# Patient Record
Sex: Female | Born: 1980 | Race: Black or African American | Hispanic: No | Marital: Married | Smoking: Never smoker
Health system: Southern US, Community
[De-identification: ages and names within clinical notes are randomized; demographics above are authoritative.]

## PROBLEM LIST (undated history)

## (undated) DIAGNOSIS — R87810 Cervical high risk human papillomavirus (HPV) DNA test positive: Secondary | ICD-10-CM

## (undated) DIAGNOSIS — E669 Obesity, unspecified: Secondary | ICD-10-CM

## (undated) DIAGNOSIS — I10 Essential (primary) hypertension: Secondary | ICD-10-CM

## (undated) DIAGNOSIS — Z0000 Encounter for general adult medical examination without abnormal findings: Secondary | ICD-10-CM

## (undated) DIAGNOSIS — G4730 Sleep apnea, unspecified: Secondary | ICD-10-CM

## (undated) HISTORY — DX: Obesity, unspecified: E66.9

## (undated) HISTORY — PX: TUBAL LIGATION: SHX77

## (undated) HISTORY — DX: Sleep apnea, unspecified: G47.30

## (undated) HISTORY — DX: Encounter for general adult medical examination without abnormal findings: Z00.00

## (undated) HISTORY — DX: Essential (primary) hypertension: I10

## (undated) HISTORY — DX: Cervical high risk human papillomavirus (HPV) DNA test positive: R87.810

---

## 2001-05-27 ENCOUNTER — Other Ambulatory Visit: Admission: RE | Admit: 2001-05-27 | Discharge: 2001-05-27 | Payer: Self-pay | Admitting: Obstetrics and Gynecology

## 2001-06-06 ENCOUNTER — Emergency Department (HOSPITAL_COMMUNITY): Admission: EM | Admit: 2001-06-06 | Discharge: 2001-06-06 | Payer: Self-pay | Admitting: Emergency Medicine

## 2001-10-27 ENCOUNTER — Ambulatory Visit (HOSPITAL_COMMUNITY): Admission: RE | Admit: 2001-10-27 | Discharge: 2001-10-27 | Payer: Self-pay | Admitting: Obstetrics and Gynecology

## 2001-11-01 ENCOUNTER — Ambulatory Visit (HOSPITAL_COMMUNITY): Admission: RE | Admit: 2001-11-01 | Discharge: 2001-11-01 | Payer: Self-pay | Admitting: Internal Medicine

## 2001-11-04 ENCOUNTER — Ambulatory Visit (HOSPITAL_COMMUNITY): Admission: RE | Admit: 2001-11-04 | Discharge: 2001-11-04 | Payer: Self-pay | Admitting: Obstetrics and Gynecology

## 2001-11-07 ENCOUNTER — Ambulatory Visit (HOSPITAL_COMMUNITY): Admission: RE | Admit: 2001-11-07 | Discharge: 2001-11-07 | Payer: Self-pay | Admitting: Obstetrics and Gynecology

## 2001-11-09 ENCOUNTER — Inpatient Hospital Stay (HOSPITAL_COMMUNITY): Admission: RE | Admit: 2001-11-09 | Discharge: 2001-11-13 | Payer: Self-pay | Admitting: Obstetrics and Gynecology

## 2001-11-10 ENCOUNTER — Encounter: Payer: Self-pay | Admitting: Obstetrics and Gynecology

## 2002-06-18 ENCOUNTER — Emergency Department (HOSPITAL_COMMUNITY): Admission: EM | Admit: 2002-06-18 | Discharge: 2002-06-18 | Payer: Self-pay | Admitting: *Deleted

## 2003-01-21 ENCOUNTER — Emergency Department (HOSPITAL_COMMUNITY): Admission: EM | Admit: 2003-01-21 | Discharge: 2003-01-21 | Payer: Self-pay | Admitting: Internal Medicine

## 2003-11-04 ENCOUNTER — Emergency Department (HOSPITAL_COMMUNITY): Admission: EM | Admit: 2003-11-04 | Discharge: 2003-11-05 | Payer: Self-pay | Admitting: *Deleted

## 2004-09-16 ENCOUNTER — Emergency Department (HOSPITAL_COMMUNITY): Admission: EM | Admit: 2004-09-16 | Discharge: 2004-09-16 | Payer: Self-pay | Admitting: Emergency Medicine

## 2004-11-07 ENCOUNTER — Ambulatory Visit: Payer: Self-pay | Admitting: Family Medicine

## 2004-12-29 ENCOUNTER — Emergency Department (HOSPITAL_COMMUNITY): Admission: EM | Admit: 2004-12-29 | Discharge: 2004-12-29 | Payer: Self-pay | Admitting: Emergency Medicine

## 2005-05-05 ENCOUNTER — Emergency Department (HOSPITAL_COMMUNITY): Admission: EM | Admit: 2005-05-05 | Discharge: 2005-05-06 | Payer: Self-pay

## 2005-09-14 ENCOUNTER — Ambulatory Visit: Payer: Self-pay | Admitting: Family Medicine

## 2005-10-23 ENCOUNTER — Emergency Department (HOSPITAL_COMMUNITY): Admission: EM | Admit: 2005-10-23 | Discharge: 2005-10-23 | Payer: Self-pay | Admitting: Emergency Medicine

## 2005-11-03 ENCOUNTER — Inpatient Hospital Stay (HOSPITAL_COMMUNITY): Admission: AD | Admit: 2005-11-03 | Discharge: 2005-11-03 | Payer: Self-pay | Admitting: Obstetrics and Gynecology

## 2006-02-20 ENCOUNTER — Emergency Department (HOSPITAL_COMMUNITY): Admission: EM | Admit: 2006-02-20 | Discharge: 2006-02-20 | Payer: Self-pay | Admitting: Emergency Medicine

## 2006-04-29 ENCOUNTER — Observation Stay (HOSPITAL_COMMUNITY): Admission: RE | Admit: 2006-04-29 | Discharge: 2006-04-29 | Payer: Self-pay | Admitting: Obstetrics and Gynecology

## 2006-05-18 ENCOUNTER — Ambulatory Visit (HOSPITAL_COMMUNITY): Admission: AD | Admit: 2006-05-18 | Discharge: 2006-05-18 | Payer: Self-pay | Admitting: Obstetrics and Gynecology

## 2006-05-21 ENCOUNTER — Ambulatory Visit (HOSPITAL_COMMUNITY): Admission: AD | Admit: 2006-05-21 | Discharge: 2006-05-21 | Payer: Self-pay | Admitting: Obstetrics and Gynecology

## 2006-05-31 ENCOUNTER — Inpatient Hospital Stay (HOSPITAL_COMMUNITY): Admission: AD | Admit: 2006-05-31 | Discharge: 2006-06-03 | Payer: Self-pay | Admitting: Obstetrics and Gynecology

## 2006-05-31 ENCOUNTER — Encounter (INDEPENDENT_AMBULATORY_CARE_PROVIDER_SITE_OTHER): Payer: Self-pay | Admitting: *Deleted

## 2007-05-17 ENCOUNTER — Emergency Department (HOSPITAL_COMMUNITY): Admission: EM | Admit: 2007-05-17 | Discharge: 2007-05-17 | Payer: Self-pay | Admitting: Emergency Medicine

## 2007-08-03 ENCOUNTER — Ambulatory Visit: Payer: Self-pay | Admitting: Family Medicine

## 2007-08-03 LAB — CONVERTED CEMR LAB
BUN: 14 mg/dL (ref 6–23)
Basophils Absolute: 0.1 10*3/uL (ref 0.0–0.1)
Basophils Relative: 1 % (ref 0–1)
CO2: 24 meq/L (ref 19–32)
Calcium: 9.4 mg/dL (ref 8.4–10.5)
Chloride: 103 meq/L (ref 96–112)
Cholesterol: 184 mg/dL (ref 0–200)
Creatinine, Ser: 0.67 mg/dL (ref 0.40–1.20)
Eosinophils Absolute: 0.2 10*3/uL (ref 0.2–0.7)
Eosinophils Relative: 3 % (ref 0–5)
Glucose, Bld: 85 mg/dL (ref 70–99)
HCT: 38.1 % (ref 36.0–46.0)
HDL: 40 mg/dL (ref >39)
Hemoglobin: 12.1 g/dL (ref 12.0–15.0)
LDL Cholesterol: 121 mg/dL — ABNORMAL HIGH (ref 0–99)
Lymphocytes Relative: 42 % (ref 12–46)
Lymphs Abs: 2.5 10*3/uL (ref 0.7–4.0)
MCHC: 31.8 g/dL (ref 30.0–36.0)
MCV: 87.4 fL (ref 78.0–100.0)
Monocytes Absolute: 0.6 10*3/uL (ref 0.1–1.0)
Monocytes Relative: 9 % (ref 3–12)
Neutro Abs: 2.8 10*3/uL (ref 1.7–7.7)
Neutrophils Relative %: 45 % (ref 43–77)
Platelets: 503 10*3/uL — ABNORMAL HIGH (ref 150–400)
Potassium: 3.9 meq/L (ref 3.5–5.3)
RBC: 4.36 M/uL (ref 3.87–5.11)
RDW: 14.5 % (ref 11.5–15.5)
Sodium: 140 meq/L (ref 135–145)
TSH: 2.825 microintl units/mL (ref 0.350–5.50)
Total CHOL/HDL Ratio: 4.6
Triglycerides: 114 mg/dL (ref <150)
VLDL: 23 mg/dL (ref 0–40)
WBC: 6.1 10*3/uL (ref 4.0–10.5)

## 2007-08-18 ENCOUNTER — Encounter: Payer: Self-pay | Admitting: Family Medicine

## 2008-02-13 ENCOUNTER — Emergency Department (HOSPITAL_COMMUNITY): Admission: EM | Admit: 2008-02-13 | Discharge: 2008-02-13 | Payer: Self-pay | Admitting: Emergency Medicine

## 2008-05-08 ENCOUNTER — Telehealth: Payer: Self-pay | Admitting: Family Medicine

## 2008-12-15 ENCOUNTER — Emergency Department (HOSPITAL_COMMUNITY): Admission: EM | Admit: 2008-12-15 | Discharge: 2008-12-15 | Payer: Self-pay | Admitting: Emergency Medicine

## 2009-01-21 ENCOUNTER — Emergency Department (HOSPITAL_COMMUNITY): Admission: EM | Admit: 2009-01-21 | Discharge: 2009-01-21 | Payer: Self-pay | Admitting: Emergency Medicine

## 2009-08-19 ENCOUNTER — Emergency Department (HOSPITAL_COMMUNITY): Admission: EM | Admit: 2009-08-19 | Discharge: 2009-08-19 | Payer: Self-pay | Admitting: Emergency Medicine

## 2009-09-30 ENCOUNTER — Emergency Department (HOSPITAL_COMMUNITY): Admission: EM | Admit: 2009-09-30 | Discharge: 2009-09-30 | Payer: Self-pay | Admitting: Emergency Medicine

## 2009-11-24 ENCOUNTER — Emergency Department (HOSPITAL_COMMUNITY): Admission: EM | Admit: 2009-11-24 | Discharge: 2009-11-24 | Payer: Self-pay | Admitting: Emergency Medicine

## 2010-02-06 ENCOUNTER — Emergency Department (HOSPITAL_COMMUNITY): Admission: EM | Admit: 2010-02-06 | Discharge: 2010-02-06 | Payer: Self-pay | Admitting: Emergency Medicine

## 2010-09-16 NOTE — Progress Notes (Signed)
Summary: wants a phy. before ins. runs out  Phone Note Call from Patient   Summary of Call: wants to get a physical next week her insurance runs out the end of this month told her we had no openings but would ask call back at [phone] Initial call taken by: Lind Guest,  May 08, 2008 11:25 AM  Follow-up for Phone Call        pls sced before she loses her ins Follow-up by: Syliva Overman MD,  May 14, 2008 12:47 PM  Additional Follow-up for Phone Call Additional follow up Details #1::        COMING IN TOMORROW Additional Follow-up by: Lind Guest,  May 14, 2008 1:59 PM

## 2010-09-16 NOTE — Letter (Signed)
Summary: Historic Patient File  Historic Patient File   Imported By: Lind Guest 02/28/2010 13:30:53  _____________________________________________________________________  External Attachment:    Type:   Image     Comment:   External Document

## 2010-11-02 LAB — BASIC METABOLIC PANEL
BUN: 11 mg/dL (ref 6–23)
CO2: 23 mEq/L (ref 19–32)
Calcium: 8.8 mg/dL (ref 8.4–10.5)
Chloride: 106 mEq/L (ref 96–112)
Creatinine, Ser: 0.7 mg/dL (ref 0.4–1.2)
GFR calc Af Amer: >60 mL/min (ref >60)
GFR calc non Af Amer: >60 mL/min (ref >60)
Glucose, Bld: 120 mg/dL — ABNORMAL HIGH (ref 70–99)
Potassium: 3.2 mEq/L — ABNORMAL LOW (ref 3.5–5.1)
Sodium: 137 mEq/L (ref 135–145)

## 2010-11-02 LAB — URINALYSIS, ROUTINE W REFLEX MICROSCOPIC
Bilirubin Urine: NEGATIVE
Glucose, UA: NEGATIVE mg/dL
Ketones, ur: NEGATIVE mg/dL
Leukocytes, UA: NEGATIVE
Nitrite: NEGATIVE
Protein, ur: NEGATIVE mg/dL
Specific Gravity, Urine: >1.03 — ABNORMAL HIGH (ref 1.005–1.030)
Urobilinogen, UA: 0.2 mg/dL (ref 0.0–1.0)
pH: 5.5 (ref 5.0–8.0)

## 2010-11-02 LAB — DIFFERENTIAL
Basophils Absolute: 0.1 10*3/uL (ref 0.0–0.1)
Basophils Relative: 1 % (ref 0–1)
Eosinophils Absolute: 0.2 10*3/uL (ref 0.0–0.7)
Eosinophils Relative: 3 % (ref 0–5)
Lymphocytes Relative: 34 % (ref 12–46)
Lymphs Abs: 2.2 10*3/uL (ref 0.7–4.0)
Monocytes Absolute: 0.4 10*3/uL (ref 0.1–1.0)
Monocytes Relative: 6 % (ref 3–12)
Neutro Abs: 3.6 10*3/uL (ref 1.7–7.7)
Neutrophils Relative %: 56 % (ref 43–77)

## 2010-11-02 LAB — POCT CARDIAC MARKERS
CKMB, poc: <1 ng/mL — ABNORMAL LOW (ref 1.0–8.0)
Myoglobin, poc: 44.2 ng/mL (ref 12–200)
Troponin i, poc: <0.05 ng/mL (ref 0.00–0.09)

## 2010-11-02 LAB — CBC
HCT: 33.9 % — ABNORMAL LOW (ref 36.0–46.0)
Hemoglobin: 11.2 g/dL — ABNORMAL LOW (ref 12.0–15.0)
MCHC: 32.9 g/dL (ref 30.0–36.0)
MCV: 87.9 fL (ref 78.0–100.0)
Platelets: 392 10*3/uL (ref 150–400)
RBC: 3.86 MIL/uL — ABNORMAL LOW (ref 3.87–5.11)
RDW: 15.4 % (ref 11.5–15.5)
WBC: 6.4 10*3/uL (ref 4.0–10.5)

## 2010-11-02 LAB — URINE MICROSCOPIC-ADD ON

## 2010-11-02 LAB — POCT PREGNANCY, URINE: Preg Test, Ur: NEGATIVE

## 2010-11-05 LAB — DIFFERENTIAL
Basophils Absolute: 0 10*3/uL (ref 0.0–0.1)
Basophils Relative: 1 % (ref 0–1)
Eosinophils Absolute: 0.2 10*3/uL (ref 0.0–0.7)
Eosinophils Relative: 3 % (ref 0–5)
Lymphocytes Relative: 37 % (ref 12–46)
Lymphs Abs: 2.2 10*3/uL (ref 0.7–4.0)
Monocytes Absolute: 0.4 10*3/uL (ref 0.1–1.0)
Monocytes Relative: 7 % (ref 3–12)
Neutro Abs: 3 10*3/uL (ref 1.7–7.7)
Neutrophils Relative %: 52 % (ref 43–77)

## 2010-11-05 LAB — POCT CARDIAC MARKERS
CKMB, poc: <1 ng/mL — ABNORMAL LOW (ref 1.0–8.0)
Myoglobin, poc: 24.6 ng/mL (ref 12–200)
Troponin i, poc: <0.05 ng/mL (ref 0.00–0.09)

## 2010-11-05 LAB — BASIC METABOLIC PANEL
BUN: 12 mg/dL (ref 6–23)
CO2: 26 mEq/L (ref 19–32)
Calcium: 8.8 mg/dL (ref 8.4–10.5)
Chloride: 104 mEq/L (ref 96–112)
Creatinine, Ser: 0.94 mg/dL (ref 0.4–1.2)
GFR calc Af Amer: >60 mL/min (ref >60)
GFR calc non Af Amer: >60 mL/min (ref >60)
Glucose, Bld: 111 mg/dL — ABNORMAL HIGH (ref 70–99)
Potassium: 3.3 mEq/L — ABNORMAL LOW (ref 3.5–5.1)
Sodium: 137 mEq/L (ref 135–145)

## 2010-11-05 LAB — CBC
HCT: 33.8 % — ABNORMAL LOW (ref 36.0–46.0)
Hemoglobin: 11.4 g/dL — ABNORMAL LOW (ref 12.0–15.0)
MCHC: 33.6 g/dL (ref 30.0–36.0)
MCV: 87.3 fL (ref 78.0–100.0)
Platelets: 406 10*3/uL — ABNORMAL HIGH (ref 150–400)
RBC: 3.87 MIL/uL (ref 3.87–5.11)
RDW: 15.4 % (ref 11.5–15.5)
WBC: 5.8 10*3/uL (ref 4.0–10.5)

## 2010-11-05 LAB — D-DIMER, QUANTITATIVE: D-Dimer, Quant: 0.33 ug/mL-FEU (ref 0.00–0.48)

## 2010-11-24 LAB — CBC
HCT: 32.7 % — ABNORMAL LOW (ref 36.0–46.0)
Hemoglobin: 11.3 g/dL — ABNORMAL LOW (ref 12.0–15.0)
MCHC: 34.6 g/dL (ref 30.0–36.0)
MCV: 87.1 fL (ref 78.0–100.0)
Platelets: 366 10*3/uL (ref 150–400)
RBC: 3.75 MIL/uL — ABNORMAL LOW (ref 3.87–5.11)
RDW: 15.3 % (ref 11.5–15.5)
WBC: 6.6 10*3/uL (ref 4.0–10.5)

## 2010-11-24 LAB — URINE MICROSCOPIC-ADD ON

## 2010-11-24 LAB — URINALYSIS, ROUTINE W REFLEX MICROSCOPIC
Bilirubin Urine: NEGATIVE
Glucose, UA: NEGATIVE mg/dL
Ketones, ur: NEGATIVE mg/dL
Leukocytes, UA: NEGATIVE
Nitrite: NEGATIVE
Protein, ur: NEGATIVE mg/dL
Specific Gravity, Urine: 1.025 (ref 1.005–1.030)
Urobilinogen, UA: 0.2 mg/dL (ref 0.0–1.0)
pH: 5.5 (ref 5.0–8.0)

## 2010-11-24 LAB — BASIC METABOLIC PANEL
BUN: 17 mg/dL (ref 6–23)
CO2: 28 mEq/L (ref 19–32)
Calcium: 9.1 mg/dL (ref 8.4–10.5)
Chloride: 107 mEq/L (ref 96–112)
Creatinine, Ser: 0.67 mg/dL (ref 0.4–1.2)
GFR calc Af Amer: >60 mL/min (ref >60)
GFR calc non Af Amer: >60 mL/min (ref >60)
Glucose, Bld: 90 mg/dL (ref 70–99)
Potassium: 4 mEq/L (ref 3.5–5.1)
Sodium: 139 mEq/L (ref 135–145)

## 2010-11-24 LAB — DIFFERENTIAL
Basophils Absolute: 0.1 10*3/uL (ref 0.0–0.1)
Basophils Relative: 1 % (ref 0–1)
Eosinophils Absolute: 0.1 10*3/uL (ref 0.0–0.7)
Eosinophils Relative: 2 % (ref 0–5)
Lymphocytes Relative: 35 % (ref 12–46)
Lymphs Abs: 2.3 10*3/uL (ref 0.7–4.0)
Monocytes Absolute: 0.4 10*3/uL (ref 0.1–1.0)
Monocytes Relative: 7 % (ref 3–12)
Neutro Abs: 3.7 10*3/uL (ref 1.7–7.7)
Neutrophils Relative %: 56 % (ref 43–77)

## 2010-11-24 LAB — GLUCOSE, CAPILLARY: Glucose-Capillary: 72 mg/dL (ref 70–99)

## 2010-11-24 LAB — PREGNANCY, URINE: Preg Test, Ur: NEGATIVE

## 2010-11-25 LAB — URINALYSIS, ROUTINE W REFLEX MICROSCOPIC
Bilirubin Urine: NEGATIVE
Glucose, UA: NEGATIVE mg/dL
Leukocytes, UA: NEGATIVE
Nitrite: NEGATIVE
Protein, ur: NEGATIVE mg/dL
Specific Gravity, Urine: >1.03 — ABNORMAL HIGH (ref 1.005–1.030)
Urobilinogen, UA: 1 mg/dL (ref 0.0–1.0)
pH: 6.5 (ref 5.0–8.0)

## 2010-11-25 LAB — URINE CULTURE: Colony Count: 2000

## 2010-11-25 LAB — URINE MICROSCOPIC-ADD ON

## 2010-11-25 LAB — PREGNANCY, URINE: Preg Test, Ur: NEGATIVE

## 2010-12-02 ENCOUNTER — Emergency Department (HOSPITAL_COMMUNITY)
Admission: EM | Admit: 2010-12-02 | Discharge: 2010-12-02 | Disposition: A | Discharge status: Home / Self Care | Payer: Self-pay | Attending: Emergency Medicine | Admitting: Emergency Medicine

## 2010-12-02 DIAGNOSIS — H1030 Unspecified acute conjunctivitis, unspecified eye: Secondary | ICD-10-CM | POA: Insufficient documentation

## 2010-12-02 DIAGNOSIS — J301 Allergic rhinitis due to pollen: Secondary | ICD-10-CM | POA: Insufficient documentation

## 2011-01-02 NOTE — H&P (Signed)
Renee Bates, Renee Bates              ACCOUNT NO.:  [id number]   MEDICAL RECORD NO.:  [id number]          PATIENT TYPE:  INP   LOCATION:  A415                          FACILITY:  [facility]   PHYSICIAN:  Tilda Burrow, M.D. DATE OF BIRTH:  1981-04-14   DATE OF ADMISSION:  05/31/2006  DATE OF DISCHARGE:  LH                                HISTORY & PHYSICAL   REASON FOR ADMISSION:  1. Pregnancy at 37 weeks and 4 days.  2. Previous cesarean section with regular uterine contractions, bloody      show, and cervix 1 cm.   MEDICAL HISTORY:  Negative.   SURGICAL HISTORY:  Positive for previous cesarean section.   ALLERGIES:  She has no known allergies.   SOCIAL HISTORY:  She is single.   FAMILY HISTORY:  Positive for hypertension, diabetes.   PRENATAL COURSE:  Essentially uneventful.  Blood type is O-positive.  UDS  negative.  Rubella is immune.  Hepatitis B surface antigen negative.  HIV is  nonreactive.  HSV is negative.  Serology nonreactive.  GC and Chlamydia on  both cultures are negative. AFP she declined.  Twenty-eight hemoglobin 9.1,  28-week hematocrit 28.3.  One-hour glucose 106.   PHYSICAL EXAM:  Weight is 229, blood pressure 130/80.  There is 2+ blood in  her urine and 2+ leuks.  Fetal heart rate is 160, strong, and regular.  Cervix is a fingertip, feels somewhat thin, mid position, presenting part is  still fairly high in the pelvis.  There is bloody show noted on exam.   PLAN:  We are going to admit for Dr. Emelda Fear to assess and evaluate for  labor.      Zerita Boers, Lanier Clam      Tilda Burrow, M.D.  Electronically Signed    DL/MEDQ  D:  04/54/0981  T:  05/31/2006  Job:  [id number]   cc:   Dr. Renette Butters

## 2011-01-02 NOTE — H&P (Signed)
Renee Bates, Renee Bates              ACCOUNT NO.:  [id number]   MEDICAL RECORD NO.:  [id number]          PATIENT TYPE:  INP   LOCATION:  A415                          FACILITY:  [facility]   PHYSICIAN:  Tilda Burrow, M.D. DATE OF BIRTH:  [birth year]   DATE OF ADMISSION:  05/31/2006  DATE OF DISCHARGE:  LH                                HISTORY & PHYSICAL   ADMISSION DIAGNOSIS:  Pregnancy at 30-1/[redacted] weeks gestation.  Repeat cesarean  section, not for trial of labor, desire for permanent sterilization.   HISTORY OF PRESENT ILLNESS:  This 30 year old gravida 2, para 1, ab 0,  living 2, due to twin gestation of first pregnancy, is admitted at this time  after presenting with labor symptoms with contractions, sufficient bloody  show upon arrival at 11:30 a.m.  Prenatal course has been followed through  our office.  She originally considered VBAC but after deciding that this was  going to be her child, she decided appropriately to get repeat cesarean  section and tubal ligation.   PAST MEDICAL HISTORY:  Benign.   PAST SURGICAL HISTORY:  Cesarean section.   ALLERGIES:  None known.   Prenatal course notable for blood type O positive.  Antibody screen  negative.  Rubella immune.  Present hemoglobin 11, hematocrit 35. Hepatitis,  HIV, RPR, GC and Chlamydia negative.  Pap smear class I.  MSAFP declined.  Twenty-eight week hemoglobin indicating mild anemia with hemoglobin of 9.1  and hematocrit 28.  Glucose tolerance test 106 mg%.  Plans on taking the  baby to Dr. Geanie Cooley of [facility].   PHYSICAL EXAMINATION:  VITAL SIGNS:  Height 5 feet 2 inches, weight 237,  blood pressure 130/80, pulse 72.  Pupils equal, round and reactive  __________.  NECK:  Supple, trachea midline.  CHEST:  Clear to auscultation.  ABDOMEN:  Nontender.  External monitor shows contractions regular in nature  every seven minutes with light bloody show.  Cervix 1 cm by Lenard Simmer  office exam.   PLAN:  Admit begin fluid hydration, proceed to operating room for repeat  cesarean section today, midday.      Tilda Burrow, M.D.  Electronically Signed     JVF/MEDQ  D:  05/31/2006  T:  05/31/2006  Job:  [id number]   cc:   Corrie Mckusick, M.D.  Fax: [phone]   Francoise Schaumann. Raynelle Highland  Fax: [phone]   [facility]   Short Stay Center  [facility]

## 2011-01-02 NOTE — Discharge Summary (Signed)
[facility]  Patient:    Renee Bates, Renee Bates Visit Number: [id number] MRN: [id number]          Service Type: OBS Location: 4A A417 01 Attending Physician:  Tilda Burrow Dictated by:   Duane Lope, M.D. Admit Date:  11/09/2001 Discharge Date: 11/13/2001                             Discharge Summary  DISCHARGE DIAGNOSES: 1. Status post a low transverse cesarean section for twins. 2. Anemia. 3. Hypertension. 4. Mild postoperative pulmonary edema.  PROCEDURES:  A primary low transverse cesarean section for twins.  Please refer to the transcribed History and Physical and Operative Note for details of admission to hospital.  HOSPITAL COURSE:  The patient was admitted after she had her C-section for twins which went well. She was anemic postoperatively and was so postoperatively as well. Her lowest H&H was a 7.6 and 22.5, which is basically stable. She remained afebrile throughout the postoperative course, tolerated clear liquids and then a full diet, had flatus and bowel movement, and her incision was clean, dry, and intact. She did have on postoperative day #1 some low saturations and was found to have some mild pulmonary edema probably secondary her hypodynamic state with her anemia, the fact that she had a twin pregnancy, and that she had increased afterload with a blood pressure that was elevated. As a result, we treated her with Lasix and diminishing her afterload with Procardia XL which worked very well. She is doing great now with no complaints at all. She is ambulatory, voiding without complaints, and tolerating oral pain medicine with good relief. She will be discharged to home and followed up in the office in one week to check her CBC. She is discharged on Tylox, Motrin, Chromagen one a day, and Procardia XL 30 mg one a day. Dictated by:   Duane Lope, M.D. Attending Physician:  Tilda Burrow DD:  11/13/01 TD:  11/13/01 Job:  [id number]

## 2011-01-02 NOTE — Op Note (Signed)
[facility]  Patient:    Renee Bates, Renee Bates Visit Number: [id number] MRN: [id number]          Service Type: OBS Location: 4A A417 01 Attending Physician:  Tilda Burrow Dictated by:   Christin Bach, M.D. Admit Date:  11/09/2001   CC:         Lilyan Punt, M.D.   Operative Report  PREOPERATIVE DIAGNOSIS:  Pregnancy twins, 37 weeks, active labor, declining the effort of vaginal delivery.  POSTOPERATIVE DIAGNOSIS:  Pregnancy twins, 37 weeks, active labor, declining the effort of vaginal delivery.  OPERATION/PROCEDURE:  Primary low transverse cervical cesarean section.  SURGEON:  Christin Bach, M.D.  ASSISTANT:  Zerita Boers, N.M.  ANESTHESIA:  Spinal, Wahler, C.N.A.  COMPLICATIONS:  None.  ESTIMATED BLOOD LOSS:  400 cc.  FINDINGS:  Baby A female, vertex presentation well engaged in the pelvis, 6 pounds 9.3 ounces.  Baby B female, 5 pounds 1.5 ounces.  Apgars 8 and 9 each, cared for by Dr. Lilyan Punt, dictated elsewhere.  DETAILS OF PROCEDURE:  The patient was taken to the operating room and spinal anesthesia introduced.  The abdomen was prepped and draped and a Pfannenstiel incision performed in standard fashion with easy identification of bladder flap.  On the well-developed, lower uterine segment, a transverse uterine incision made with knife and extended using the index finger for traction. Fetal vertex was gently elevated out of the pelvis and rotated into the incision and the infant delivered by fundal pressure.  The infant was delivered easily; bulb suctioning of the clear amniotic fluid was performed; and then the cord clamped and then the infant passed to the waiting physician Dr. Lilyan Punt.  Apgars of 8 and 9 were assigned.  The second infant was found to be a vertex presentation.  Membranes ruptured, vertex guided through the incision, with easy delivery as well.  Cord was clamped and the infant passed to awaiting  pediatrician.  The baby was somewhat smaller but excellent respiratory effort on both infants noted.  Cord blood samples were obtained from each infant.  The placenta delivered intact.  Two separate placental beds noted, and the uterus irrigated with antibiotic solution and then a single layer running locking closure of the uterus performed.  The patient then had the bladder flap reapproximated with 2-0 chromic after a single additional interrupted suture of #0 chromic was necessary on the inferior aspects of the bladder flap.  The procedure then continued with 2-0 chromic closure of the bladder flap, irrigation of the abdomen, closure of the anterior peritoneum using 2-0 chromic, closure of the fascia with #0 Vicryl, and closure of the subcu fatty tissues with 2-0 plain and staple closure of the skin.  The patient tolerated the procedure well and went to recovery room in good condition. Dictated by:   Christin Bach, M.D. Attending Physician:  Tilda Burrow DD:  11/09/01 TD:  11/10/01 Job: [id number]

## 2011-01-02 NOTE — H&P (Signed)
[facility]  Patient:    Renee Bates, Renee Bates Visit Number: [id number] MRN: [id number]          Service Type: OBS Location: 4A A414 01 Attending Physician:  Tilda Burrow Dictated by:   Christin Bach, M.D. Admit Date:  11/07/2001 Discharge Date: 11/07/2001                           History and Physical  SUBJECTIVE:  Gravida 1, para 0 LMP July 25 placing [facility] April 30 with ultrasound assigned [facility] of April 15.  She is 37 weeks with best criteria.  She has been in labor since last night and presents to our office for evaluation of abdominal discomfort and is seen at 11:50 a.m. and she is progressing nicely.  Cervix is 5 cm dilated, 100% effaced, -1/-2 station and I have explained to the patient the excellent progress that she has made so far.  We have encouraged her to consider vaginal birth.  Patient declines option of same.  Baby B is also in a vertex presentation.  PAST MEDICAL HISTORY:  Benign.  PAST SURGICAL HISTORY:  Negative.  ALLERGIES:  No known drug allergies.  PHYSICAL EXAMINATION  VITAL SIGNS:  Height 5 feet 3 inches, weight 207 pounds which is a 36 pound weight gain, blood pressure 160/80.  ABDOMEN:  Fundal height 49 cm.  Estimated fetal weight 5 pounds on each baby.  LABORATORIES:  Urinalysis shows no blood, leukocytes.  PLAN:  Transfer to labor and delivery.  We have encouraged patient to consider vaginal birth but she, is at this time, not inclined to consider it.  Will send to labor and delivery and prepare for cesarean delivery.  ADDENDUM:  Blood type O+.  Urine drug screen negative.  Rubella immunity. Present hemoglobin 11, hematocrit 34.  Hepatitis, HIV, GC, chlamydia, RPR are all negative. Dictated by:   Christin Bach, M.D. Attending Physician:  Tilda Burrow DD:  11/09/01 TD:  11/09/01 Job: [id number]

## 2011-01-02 NOTE — H&P (Signed)
Renee Bates, Renee Bates              ACCOUNT NO.:  [id number]   MEDICAL RECORD NO.:  [id number]          PATIENT TYPE:  OIB   LOCATION:  A415                          FACILITY:  [facility]   PHYSICIAN:  Tilda Burrow, M.D. DATE OF BIRTH:  07-31-1981   DATE OF ADMISSION:  04/29/2006  DATE OF DISCHARGE:  LH                                HISTORY & PHYSICAL   ADMISSION DIAGNOSES:  1. Pregnancy at approximately 31 weeks.  2. Slipped on a puddle of water in the floor and fell on her bottom.  3. Lower abdominal pain.   PAST MEDICAL HISTORY:  Negative.   PAST SURGICAL HISTORY:  Positive for cesarean section.   ALLERGIES:  No known drug allergies.   FAMILY HISTORY:  Positive for hypertension and diabetes.   PRENATAL COURSE:  Essentially uneventful.  Blood type O positive, UDS  negative, rubella immune, hepatitis B surface antigen negative, HIV  nonreactive, serology nonreactive, GC and Chlamydia are negative.   PHYSICAL EXAMINATION:  VITAL SIGNS:  Fetal heart rate pattern is reactive  and has been reactive throughout the night.  The patient has required some  Tylox for pain in her vaginal area which responded well to Tylox.  PELVIC:  There is no suprapubic tenderness, no abdominal tenderness or  rigidity.   PLAN:  We are going to discharge her home to follow up Friday in the office.  Lortab one p.o. q.4 hours p.r.n. pain.  She should notify us of any  decreased fetal movement or problems.      Renee Bates, Renee Bates      Tilda Burrow, M.D.  Electronically Signed   DL/MEDQ  D:  16/05/9603  T:  04/29/2006  Job:  [id number]   cc:   [facility]

## 2011-01-02 NOTE — Op Note (Signed)
NAME:  Renee Bates, Renee Bates              ACCOUNT NO.:  [id number]   MEDICAL RECORD NO.:  [id number]          PATIENT TYPE:  INP   LOCATION:  A402                          FACILITY:  [facility]   PHYSICIAN:  Tilda Burrow, M.D. DATE OF BIRTH:  [birth year]   DATE OF PROCEDURE:  DATE OF DISCHARGE:                                 OPERATIVE REPORT   PREOPERATIVE DIAGNOSES:  1. Pregnancy 37 plus weeks.  2. Repeat cesarean section, not for trial of labor.  3. Prodromal labor symptoms.  4. Elective permanent sterilization.   POSTOPERATIVE DIAGNOSES:  1. Pregnancy 37 plus weeks.  2. Repeat cesarean section, not for trial of labor.  3. Prodromal labor symptoms.  4. Elective permanent sterilization.   PROCEDURE:  Repeat low transverse cervical cesarean section, bilateral  partial salpingectomy.   SURGEON:  Tilda Burrow, M.D.   ASSISTANT:  None.   ANESTHESIA:  Spinal.   COMPLICATIONS:  None.   FINDINGS:  A healthy female infant, Apgar's 9 and 9, weight __________.   INDICATIONS FOR PROCEDURE:  A 30 year old female with prior cesarean for  twins who presents with prodromal symptoms, contractions, moderately  uncomfortable with light bloody show on the morning of May 31, 2006.   DESCRIPTION OF PROCEDURE:  The patient was taken to the operating room,  prepped and draped for lower abdominal surgery with spinal anesthesia  introduced. The old cicatrix was widely excised removing approximately a 2  inch wide strip of skin including the old cicatrix and then the fascia was  opened transversely with ease. The peritoneal cavity was opened in the  midline, some omental adhesions to the anterior abdominal wall removed,  bladder flap developed on the lower uterine segment and a transverse uterine  incision made using the knife followed by lateral traction and guidance of  the vertex through the incision. Amniotic fluid was clear without malodor.  Bulb suctioning was performed prior to delivering  the fetal body, the right  arm was delivered first. The baby cried vigorously with a strong loud voice.  The amniotic fluid was without malodor.   The cord was clamped, the baby cared for by Dr. Milford Cage followed by  excretion  of the placenta, membranes intact, irrigation of the uterus with antibiotic  solution and a single layer of running locking closure of the uterine  incision. The bladder flap was closed with 2-0 chromic and the abdomen  irrigated and confirmed as hemostatic.   Tubal ligation was then performed by identifying each fallopian tube,  elevating the tube, doubly ligating around the mid segment knuckle of tube  and excising the incarcerated knuckle of tube for histologic confirmation of  sterilization. This was performed without difficulty bilaterally.   The anterior peritoneum was irrigated, closed with 2-0 chromic, the fascia  trimmed of some rough edges on the inferior aspect and then closed with  running #0 Vicryl and then subcutaneous tissues closed with 5 interrupted  sutures of 2-0 plain with good skin edge reapproximation. Sponge and needle  counts correct.      Tilda Burrow, M.D.  Electronically Signed  JVF/MEDQ  D:  05/31/2006  T:  06/01/2006  Job:  [id number]   cc:   Francoise Schaumann. Milford Cage DO, FAAP  Fax: [phone]

## 2011-01-02 NOTE — Discharge Summary (Signed)
[name], Renee Bates              ACCOUNT NO.:  [id number]   MEDICAL RECORD NO.:  [id number]          PATIENT TYPE:  INP   LOCATION:  A412                          FACILITY:  [facility]   PHYSICIAN:  Tilda Burrow, M.D. DATE OF BIRTH:  [birth year]   DATE OF ADMISSION:  05/31/2006  DATE OF DISCHARGE:  10/18/2007LH                                 DISCHARGE SUMMARY   ADMISSION DIAGNOSES:  1. Pregnancy at 37 weeks for repeat cesarean section after trial of labor.  2. Prodromal labor.  3. Elective sterilization.   DISCHARGE DIAGNOSES:  1. Pregnancy at 37 weeks for repeat cesarean section after trial of labor,      delivered.  2. Prodromal labor.  3. Elective sterilization.   PROCEDURES:  Repeat low transverse cervical cesarean section and bilateral  partial salpingectomy, Tilda Burrow, M.D., performed on the admission  date which was May 31, 2006, discharged June 03, 2006.   DISCHARGE MEDICATIONS:  1. Vicodin 5/500, 30 tablets, 1-2 q. 4 h p.r.n. pain.  2. Prenatal vitamins daily x30 days.  3. Chromagen Forte twice daily x30 days.   FOLLOW UP:  Eight days staple removal.   HOSPITAL SUMMARY:  This is a 30 year old female who was admitted after  presenting with mild labor symptoms on May 31, 2006.   HOSPITAL COURSE:  The patient was admitted, had uncomplicated cesarean  section and tubal ligation with wide excision of cicatrix to try to leave  her with a smooth surgical scar.  The patient did well post surgically.  Stayed 72 yours postop. Was discharged home on October 18 with hemoglobin 9,  hematocrit 28.  Maternal blood type O positive.  The baby is not  circumcised. She will be bottle feeding.  Follow up in 8 days.      Tilda Burrow, M.D.  Electronically Signed     JVF/MEDQ  D:  06/03/2006  T:  06/04/2006  Job:  [id number]   cc:   [facility]   Francoise Schaumann. Milford Cage DO, FAAP  Fax: [phone]

## 2011-05-16 ENCOUNTER — Encounter: Payer: Self-pay | Admitting: *Deleted

## 2011-05-16 ENCOUNTER — Emergency Department (HOSPITAL_COMMUNITY)
Admission: EM | Admit: 2011-05-16 | Discharge: 2011-05-16 | Disposition: A | Discharge status: Home / Self Care | Payer: Self-pay | Attending: Emergency Medicine | Admitting: Emergency Medicine

## 2011-05-16 DIAGNOSIS — R252 Cramp and spasm: Secondary | ICD-10-CM | POA: Insufficient documentation

## 2011-05-16 DIAGNOSIS — M79609 Pain in unspecified limb: Secondary | ICD-10-CM | POA: Insufficient documentation

## 2011-05-16 LAB — BASIC METABOLIC PANEL
BUN: 14 mg/dL (ref 6–23)
CO2: 27 mEq/L (ref 19–32)
Calcium: 9.3 mg/dL (ref 8.4–10.5)
Chloride: 106 mEq/L (ref 96–112)
Creatinine, Ser: 0.54 mg/dL (ref 0.50–1.10)
GFR calc Af Amer: >60 mL/min (ref >60)
GFR calc non Af Amer: >60 mL/min (ref >60)
Glucose, Bld: 87 mg/dL (ref 70–99)
Potassium: 4 mEq/L (ref 3.5–5.1)
Sodium: 139 mEq/L (ref 135–145)

## 2011-05-16 LAB — D-DIMER, QUANTITATIVE: D-Dimer, Quant: 0.22 ug/mL-FEU (ref 0.00–0.48)

## 2011-05-16 MED ORDER — OXYCODONE-ACETAMINOPHEN 5-325 MG PO TABS: 1.0000 | ORAL_TABLET | ORAL | Status: AC | PRN

## 2011-05-16 MED ORDER — IBUPROFEN 600 MG PO TABS: 600.0000 mg | ORAL_TABLET | Freq: Four times a day (QID) | ORAL | Status: AC | PRN

## 2011-05-16 MED ORDER — OXYCODONE-ACETAMINOPHEN 5-325 MG PO TABS
2.0000 | ORAL_TABLET | Freq: Once | ORAL | Status: AC
  Administered 2011-05-16: 2 via ORAL
  Filled 2011-05-16: qty 2

## 2011-05-16 NOTE — ED Provider Notes (Signed)
History  Scribed for Dr.Treina Arscott, the patient was seen in room APA12. The chart was scribed by Gilman Schmidt. The patients care was started at 0922.  CSN: [id number] Arrival date & time: 05/16/2011  9:12 AM  Chief Complaint  Patient presents with  . Leg Pain   HPI Renee Bates is a 30 y.o. female who presents to the Emergency Department complaining of sharp right leg calf pain. Pt reports waking up with pain and not being able to walk to the bathroom. States that she went to bed feeling fine. Pt denies any recent injury or recent activity. Symptoms are exacerbated by stretching out leg and by walking. Denies any weakness in foot, CP, or SOB. Denies being on birth control. Denies any recent travel. There are no other associated symptoms and no other alleviating or aggravating factors.   HPI ELEMENTS:  Location: right leg calf Onset: this morning Duration: persistent since onset  Timing: constant  Quality: sharp  Modifying factors: exacerbated by stretching out leg and walking Context:  as above  Associated symptoms: denies any weakness in foot, CP, or SOB   PAST MEDICAL HISTORY:  History reviewed. No pertinent past medical history.   PAST SURGICAL HISTORY:  Past Surgical History  Procedure Date  . Cesarean section   . Tubal ligation      MEDICATIONS:  Previous Medications   No medications on file   Not currently on birth control.   ALLERGIES:  Allergies as of 05/16/2011  . (No Known Allergies)     FAMILY HISTORY:  History reviewed. No pertinent family history.   SOCIAL HISTORY: History  Substance Use Topics  . Smoking status: Never Smoker   . Smokeless tobacco: Not on file  . Alcohol Use: Yes     occasionally   No recent travel.   Review of Systems  Respiratory: Negative for shortness of breath.   Cardiovascular: Negative for chest pain and leg swelling.  Musculoskeletal:       Leg Pain  Neurological: Negative for weakness and numbness.  All other systems  reviewed and are negative.    Allergies  Review of patient's allergies indicates no known allergies.  Home Medications  No current outpatient prescriptions on file.  BP 154/104  Pulse 75  Temp(Src) 98.5 F (36.9 C) (Oral)  Resp 17  Ht 5\' 2"  (1.575 m)  Wt 185 lb (83.915 kg)  BMI 33.84 kg/m2  SpO2 99%  LMP 05/07/2011  Physical Exam  CONSTITUTIONAL: Well developed/well nourished HEAD AND FACE: Normocephalic/atraumatic EYES: EOMI/PERRL ENMT: Mucous membranes moist NECK: supple no meningeal signs CV: S1/S2 noted, no murmurs/rubs/gallops noted LUNGS: Lungs are clear to auscultation bilaterally, no apparent distress ABDOMEN: soft, nontender, no rebound or guarding NEURO: Pt is awake/alert, moves all extremitiesx4, distal n/v intact on right LE EXTREMITIES: pulses normal, point tenderness in calf of right leg, no redness, no cord noted, no edema  Right Achilles intact SKIN: warm, color normal PSYCH: no abnormalities of mood noted  ED Course  Procedures  OTHER DATA REVIEWED: Nursing notes, vital signs, and past medical records reviewed. All labs/vitals reviewed and considered  DIAGNOSTIC STUDIES: Oxygen Saturation is 99% on room air, normal by my interpretation.    LABS: Results for orders placed during the hospital encounter of 05/16/11  D-DIMER, QUANTITATIVE      Component Value Range   D-Dimer, Quant 0.22  0.00 - 0.48 (ug/mL-FEU)  BASIC METABOLIC PANEL      Component Value Range   Sodium 139  135 -  145 (mEq/L)   Potassium 4.0  3.5 - 5.1 (mEq/L)   Chloride 106  96 - 112 (mEq/L)   CO2 27  19 - 32 (mEq/L)   Glucose, Bld 87  70 - 99 (mg/dL)   BUN 14  6 - 23 (mg/dL)   Creatinine, Ser 1.61  0.50 - 1.10 (mg/dL)   Calcium 9.3  8.4 - 09.6 (mg/dL)   GFR calc non Af Amer >60  >60 (mL/min)   GFR calc Af Amer >60  >60 (mL/min)    ED COURSE / COORDINATION OF CARE: 0922:  - Patient evaluated by ED physician, D-dimer, BMP ordered 11:03 AM Labs reassuring No evidence of  bony injury, no evidence of cellulitis No neurovascular deficits Patient is able to walk in room Advised to have blood pressure rechecked within one month    IMPRESSION: Diagnoses that have been ruled out:  Diagnoses that are still under consideration:  Final diagnoses:    PLAN:  Home Narcotic pain medication/NSAIDS The patient is to return the emergency department if there is any worsening of symptoms.   CONDITION ON DISCHARGE: Good   SCRIBE ATTESTATION: I personally performed the services described in this documentation, which was scribed in my presence. The recorded information has been reviewed and considered. Joya Gaskins, MD             Joya Gaskins, MD 05/16/11 [phone]

## 2011-05-16 NOTE — ED Notes (Signed)
Pt a/ox4. Resp even and unlabored. NAD at this time. D/C instructions and Rx x 2 reviewed with pt. Pt verbalized understanding. Pt ambulated with steady gate to nurses station and transported to lobby via w/c.

## 2011-05-16 NOTE — ED Notes (Signed)
Pt alert and oriented x 3. Skin warm and dry. Color pink. Breath sounds clear and equal bilaterally. No redness or edema noted in right calf. States that the pains are shooting.

## 2011-05-16 NOTE — ED Notes (Signed)
Pt c/o sharp pains shooting up her right calf since awakening this am. Pt denies injury. Strong pedal pulses palpated. No edema or redness noted to calf.

## 2013-08-24 ENCOUNTER — Ambulatory Visit (INDEPENDENT_AMBULATORY_CARE_PROVIDER_SITE_OTHER): Payer: BC Managed Care – PPO | Admitting: Obstetrics & Gynecology

## 2013-08-24 ENCOUNTER — Encounter: Payer: Self-pay | Admitting: Obstetrics & Gynecology

## 2013-08-24 VITALS — BP 140/90 | Ht 62.0 in | Wt 231.0 lb

## 2013-08-24 DIAGNOSIS — N920 Excessive and frequent menstruation with regular cycle: Secondary | ICD-10-CM

## 2013-08-24 MED ORDER — MEGESTROL ACETATE 40 MG PO TABS: 40.0000 mg | ORAL_TABLET | Freq: Every day | ORAL | Status: DC

## 2013-08-24 NOTE — Progress Notes (Signed)
Patient ID: Renee Bates, female   DOB: 08/18/1980, 33 y.o.   MRN: [id number] Periods have gotten worse over the past year or so Has 8 days of bleeding then off 2 weeks then back off Heavy Clots Heavy cramping, takes tylenol not helpful Had tubal  Not good on ocp, has tried before  Will suppress with megace  Follow up sonogram 1 month and consider ablation  History reviewed. No pertinent past medical history.  Past Surgical History  Procedure Laterality Date  . Cesarean section    . Tubal ligation      OB History   Grav Para Term Preterm Abortions TAB SAB Ect Mult Living                  No Known Allergies  History   Social History  . Marital Status: Single    Spouse Name: N/A    Number of Children: N/A  . Years of Education: N/A   Social History Main Topics  . Smoking status: Never Smoker   . Smokeless tobacco: None  . Alcohol Use: Yes     Comment: occasionally  . Drug Use: No  . Sexual Activity: No   Other Topics Concern  . None   Social History Narrative  . None    History reviewed. No pertinent family history.

## 2013-09-08 ENCOUNTER — Other Ambulatory Visit (HOSPITAL_COMMUNITY): Payer: Self-pay | Admitting: *Deleted

## 2013-09-08 DIAGNOSIS — N920 Excessive and frequent menstruation with regular cycle: Secondary | ICD-10-CM

## 2013-09-14 ENCOUNTER — Ambulatory Visit (HOSPITAL_COMMUNITY): Admission: RE | Admit: 2013-09-14 | Payer: BC Managed Care – PPO | Source: Ambulatory Visit

## 2013-09-14 ENCOUNTER — Ambulatory Visit (HOSPITAL_COMMUNITY): Payer: BC Managed Care – PPO | Attending: *Deleted

## 2013-09-21 ENCOUNTER — Ambulatory Visit (INDEPENDENT_AMBULATORY_CARE_PROVIDER_SITE_OTHER): Payer: BC Managed Care – PPO | Admitting: Surgery

## 2013-09-21 ENCOUNTER — Encounter (INDEPENDENT_AMBULATORY_CARE_PROVIDER_SITE_OTHER): Payer: Self-pay | Admitting: Surgery

## 2013-09-21 ENCOUNTER — Ambulatory Visit: Payer: BC Managed Care – PPO | Admitting: Obstetrics & Gynecology

## 2013-09-21 ENCOUNTER — Other Ambulatory Visit: Payer: BC Managed Care – PPO

## 2013-09-21 VITALS — BP 118/82 | HR 72 | Resp 14 | Ht 62.0 in | Wt 232.4 lb

## 2013-09-21 DIAGNOSIS — Z6841 Body Mass Index (BMI) 40.0 and over, adult: Secondary | ICD-10-CM

## 2013-09-21 DIAGNOSIS — I10 Essential (primary) hypertension: Secondary | ICD-10-CM

## 2013-09-21 LAB — CBC WITH DIFFERENTIAL/PLATELET
Basophils Absolute: 0.1 10*3/uL (ref 0.0–0.1)
Basophils Relative: 1 % (ref 0–1)
Eosinophils Absolute: 0.1 10*3/uL (ref 0.0–0.7)
Eosinophils Relative: 1 % (ref 0–5)
HCT: 34.1 % — ABNORMAL LOW (ref 36.0–46.0)
Hemoglobin: 11.6 g/dL — ABNORMAL LOW (ref 12.0–15.0)
Lymphocytes Relative: 43 % (ref 12–46)
Lymphs Abs: 2.9 10*3/uL (ref 0.7–4.0)
MCH: 28.6 pg (ref 26.0–34.0)
MCHC: 34 g/dL (ref 30.0–36.0)
MCV: 84 fL (ref 78.0–100.0)
Monocytes Absolute: 0.5 10*3/uL (ref 0.1–1.0)
Monocytes Relative: 7 % (ref 3–12)
Neutro Abs: 3.3 10*3/uL (ref 1.7–7.7)
Neutrophils Relative %: 48 % (ref 43–77)
Platelets: 497 10*3/uL — ABNORMAL HIGH (ref 150–400)
RBC: 4.06 MIL/uL (ref 3.87–5.11)
RDW: 14.2 % (ref 11.5–15.5)
WBC: 6.8 10*3/uL (ref 4.0–10.5)

## 2013-09-21 LAB — COMPREHENSIVE METABOLIC PANEL
ALT: 12 U/L (ref 0–35)
AST: 10 U/L (ref 0–37)
Albumin: 4 g/dL (ref 3.5–5.2)
Alkaline Phosphatase: 60 U/L (ref 39–117)
BUN: 17 mg/dL (ref 6–23)
CO2: 25 mEq/L (ref 19–32)
Calcium: 9.3 mg/dL (ref 8.4–10.5)
Chloride: 105 mEq/L (ref 96–112)
Creat: 0.66 mg/dL (ref 0.50–1.10)
Glucose, Bld: 109 mg/dL — ABNORMAL HIGH (ref 70–99)
Potassium: 3.9 mEq/L (ref 3.5–5.3)
Sodium: 139 mEq/L (ref 135–145)
Total Bilirubin: 0.4 mg/dL (ref 0.2–1.2)
Total Protein: 7.2 g/dL (ref 6.0–8.3)

## 2013-09-21 LAB — HEMOGLOBIN A1C
Hgb A1c MFr Bld: 5.8 % — ABNORMAL HIGH (ref <5.7)
Mean Plasma Glucose: 120 mg/dL — ABNORMAL HIGH (ref <117)

## 2013-09-21 LAB — T4: T4, Total: 8.7 ug/dL (ref 5.0–12.5)

## 2013-09-21 LAB — TSH: TSH: 1.847 u[IU]/mL (ref 0.350–4.500)

## 2013-09-21 NOTE — Progress Notes (Signed)
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 She currently has a BMI of 42. He is a nonsmoker, non-aspirin taking young woman with hypertension and he does not have diabetes. She has watched a video send a lot of work and study and has decided she would like to have a gastric bypass. I discussed that with her in some detail and she is aware of the procedure and is eager to get started on a bariatric journey.  She denies any history of DVT problems in her legs. She has had 2 prior C-sections.  Past Medical History  Diagnosis Date  . Hypertension     Past Surgical History  Procedure Laterality Date  . Cesarean section    . Tubal ligation      Current Outpatient Prescriptions  Medication Sig Dispense Refill  . hydrochlorothiazide (HYDRODIURIL) 25 MG tablet Take 25 mg by mouth daily.      . megestrol (MEGACE) 40 MG tablet Take 1 tablet (40 mg total) by mouth daily.  30 tablet  3  . acetaminophen (TYLENOL) 500 MG tablet Take 1,000 mg by mouth every 6 (six) hours as needed. For pain        No current facility-administered medications for this visit.   Review of patient's allergies indicates no known allergies. No family history on file. Social History:   reports that she has never smoked. She does not have any smokeless tobacco history on file. She reports that she drinks alcohol. She reports that she does not use illicit drugs.   REVIEW OF SYSTEMS - PERTINENT POSITIVES ONLY: Negative except for issues referable to her hypertension and obesity.  Physical Exam:   Blood pressure 118/82, pulse 72, resp. rate 14, height 5\' 2"  (1.575 m), weight 232 lb 6.4 oz (105.416 kg), last menstrual period 08/18/2013. Body mass index is 42.5 kg/(m^2).  Gen:  WDWN African American female NAD  Neurological: Alert and oriented to person,  place, and time. Motor and sensory function is grossly intact  Head: Normocephalic and atraumatic.  Eyes: Conjunctivae are normal. Pupils are equal, round, and reactive to light. No scleral icterus.  Neck: Normal range of motion. Neck supple. No tracheal deviation or thyromegaly present.  Cardiovascular:  SR without murmurs or gallops.  No carotid bruits Respiratory: Effort normal.  No respiratory distress. No chest wall tenderness. Breath sounds normal.  No wheezes, rales or rhonchi.  Abdomen:  Obese and nontender GU: Musculoskeletal: Normal range of motion. Extremities are nontender. No cyanosis, edema or clubbing noted Lymphadenopathy: No cervical, preauricular, postauricular or axillary adenopathy is present Skin: Skin is warm and dry. No rash noted. No diaphoresis. No erythema. No pallor. Pscyh: Normal mood and affect. Behavior is normal. Judgment and thought content normal.   LABORATORY RESULTS: No results found for this or any previous visit (from the past 48 hour(s)).  RADIOLOGY RESULTS: No results found.  Problem List: Patient Active Problem List   Diagnosis Date Noted  . Hypertension 09/21/2013  . Morbid obesity with body mass index of 40.0-44.9 in adult 09/21/2013    Assessment & Plan: Morbid obesity BMI 42 for consideration for laparoscopic Roux-en-Y gastric bypass.    Matt B. Daphine DeutscherMartin, MD, [facility]  [facility] [phone] [phone]  09/21/2013 11:52 AM

## 2013-09-21 NOTE — Addendum Note (Signed)
Addended by: Brennan BaileyBROOKS, Jannely Henthorn on: 09/21/2013 12:06 PM   Modules accepted: Orders

## 2013-09-21 NOTE — Patient Instructions (Signed)
Gastric Bypass Surgery Care After Refer to this sheet in the next few weeks. These discharge instructions provide you with general information on caring for yourself after you leave the hospital. Your caregiver may also give you specific instructions. Your treatment has been planned according to the most current medical practices available, but unavoidable complications sometimes occur. If you have any problems or questions after discharge, call your caregiver. HOME CARE INSTRUCTIONS  Activity  Take frequent walks throughout the day. This will help to prevent blood clots. Do not sit for longer than 45 minutes to 1 hour while awake for 4 to 6 weeks after surgery.  Continue to do coughing and deep breathing exercises once you get home. This will help to prevent pneumonia.  Do not do strenuous activities, such as heavy lifting, pushing, or pulling, until after your follow-up visit with your caregiver. Do not lift anything heavier than 10 lb (4.5 kg).  Talk with your caregiver about when you may return to work and your exercise routine.  Do not drive while taking prescription pain medicine. Nutrition  It is very important that you drink at least 80 oz (2,400 mL) of fluid a day.  You should stay on a liquid diet until your follow-up visit with your caregiver. Keep sugar-free, liquid items on hand, including:  Tea: hot or cold. Drink only decaffeinated for the first month.  Broths: beef, chicken, vegetable.  Others: water, sugar-free frozen ice pops, flavored water, gelatin (after 1 week).  Do not consume caffeine for 1 month. Large amounts of caffeine can cause dehydration.  A dietician may also give you specific instructions.  Follow your caregiver's recommendations about vitamins and protein requirements after surgery. Hygiene  You may shower and wash your hair 2 days after surgery. Pat incisions dry. Do not rub incisions with a washcloth or towel.  Follow your caregiver's  recommendations about baths and pools following surgery. Pain control  If a prescription medicine was given, follow your caregiver's directions.  You may feel some gas pain caused by the carbon dioxide used to inflate your abdomen during surgery. This pain can be felt in your chest, shoulder, back, or abdominal area. Moving around often is advised. Incision care  You may have 4 or more small incisions. They are closed with skin adhesive strips. Skin adhesive strips can get wet and will fall off on their own. Check your incisions and surrounding area daily for any redness, swelling, discoloration, fluid (drainage), or bleeding. Dark red, dried blood may appear under these coverings. This is normal.  If you have a drain, it will be removed at your follow-up visit or before you leave the hospital.  If your drain is left in, follow your caregiver's instructions on drain care.  If your drain is taken out, keep a clean, dry bandage over the drain site. SEEK MEDICAL CARE IF:   You develop persistent nausea and vomiting.  You have pain and discomfort with swallowing.  You have pain, swelling, or warmth in the lower extremities.  You have an oral temperature above 102 F (38.9 C).  You develop chills.  Your incision sites look red, swollen, or have drainage.  Your stool is black, tarry, or maroon in color.  You are lightheaded when standing.  You notice a bruise getting larger.  You have any questions or concerns. SEEK IMMEDIATE MEDICAL CARE IF:   You have chest pain.  You have severe calf pain or pain not relieved by medicine.  You develop shortness of   breath or difficulty breathing.  There is bright red blood coming from the drain.  You feel confused.  You have slurred speech.  You suddenly feel weak. MAKE SURE YOU:   Understand these instructions.  Will watch your condition.  Will get help right away if you are not doing well or get worse. Document Released:  03/17/2004 Document Revised: 11/28/2012 Document Reviewed: 12/24/2009 ExitCare Patient Information 2014 [employer].  

## 2013-09-22 ENCOUNTER — Other Ambulatory Visit: Payer: BC Managed Care – PPO

## 2013-09-22 ENCOUNTER — Ambulatory Visit: Payer: BC Managed Care – PPO | Admitting: Obstetrics & Gynecology

## 2013-09-22 LAB — PREGNANCY, URINE: Preg Test, Ur: NEGATIVE

## 2013-09-28 ENCOUNTER — Ambulatory Visit (HOSPITAL_COMMUNITY)
Admission: RE | Admit: 2013-09-28 | Discharge: 2013-09-28 | Disposition: A | Discharge status: Home / Self Care | Payer: BC Managed Care – PPO | Source: Ambulatory Visit | Attending: Surgery | Admitting: Surgery

## 2013-09-28 ENCOUNTER — Other Ambulatory Visit: Payer: Self-pay

## 2013-09-28 DIAGNOSIS — I10 Essential (primary) hypertension: Secondary | ICD-10-CM

## 2013-09-28 DIAGNOSIS — Z6841 Body Mass Index (BMI) 40.0 and over, adult: Secondary | ICD-10-CM | POA: Insufficient documentation

## 2013-09-28 DIAGNOSIS — R160 Hepatomegaly, not elsewhere classified: Secondary | ICD-10-CM | POA: Insufficient documentation

## 2013-10-13 ENCOUNTER — Ambulatory Visit (HOSPITAL_COMMUNITY)
Admission: RE | Admit: 2013-10-13 | Discharge: 2013-10-13 | Disposition: A | Discharge status: Home / Self Care | Payer: BC Managed Care – PPO | Source: Ambulatory Visit | Attending: Surgery | Admitting: Surgery

## 2013-10-13 ENCOUNTER — Encounter (HOSPITAL_COMMUNITY)
Admission: RE | Disposition: A | Discharge status: Home / Self Care | Payer: Self-pay | Source: Ambulatory Visit | Attending: Surgery

## 2013-10-13 HISTORY — PX: BREATH TEK H PYLORI: SHX5422

## 2013-10-13 SURGERY — BREATH TEST, FOR HELICOBACTER PYLORI

## 2013-10-14 ENCOUNTER — Encounter (HOSPITAL_COMMUNITY): Payer: Self-pay | Admitting: Surgery

## 2013-10-18 ENCOUNTER — Ambulatory Visit (HOSPITAL_BASED_OUTPATIENT_CLINIC_OR_DEPARTMENT_OTHER): Payer: BC Managed Care – PPO | Attending: Surgery

## 2013-10-18 VITALS — Ht 62.0 in | Wt 232.0 lb

## 2013-10-18 DIAGNOSIS — I10 Essential (primary) hypertension: Secondary | ICD-10-CM

## 2013-10-18 DIAGNOSIS — G4733 Obstructive sleep apnea (adult) (pediatric): Secondary | ICD-10-CM

## 2013-10-18 DIAGNOSIS — Z6841 Body Mass Index (BMI) 40.0 and over, adult: Secondary | ICD-10-CM

## 2013-10-21 DIAGNOSIS — I10 Essential (primary) hypertension: Secondary | ICD-10-CM

## 2013-10-21 DIAGNOSIS — G4733 Obstructive sleep apnea (adult) (pediatric): Secondary | ICD-10-CM

## 2013-10-21 NOTE — Sleep Study (Signed)
   NAME: Renee ApleyDoris H Bates DATE OF BIRTH:  04/07/1981 MEDICAL RECORD NUMBER [id number]  LOCATION: [facility]  PHYSICIAN: Corlis Angelica D  DATE OF STUDY: 10/18/2013  SLEEP STUDY TYPE: Nocturnal Polysomnogram               REFERRING PHYSICIAN: Valarie MerinoMartin, Matthew B, MD  INDICATION FOR STUDY: Hypersomnia with sleep apnea  EPWORTH SLEEPINESS SCORE:   3/24 HEIGHT: 5\' 2"  (157.5 cm)  WEIGHT: 232 lb (105.235 kg)    Body mass index is 42.42 kg/(m^2).  NECK SIZE: 14 in.  MEDICATIONS: Charted for review  SLEEP ARCHITECTURE: Total sleep time 317.5 minutes with sleep efficiency 81.1%. Stage I was 6.1%, stage II 83.8%, stage III 0.2%, REM 9.9% of total sleep time. Sleep latency 65.5 minutes, REM latency 184 minutes, awake after sleep onset 8 minutes, arousal index 12.9. Bedtime medication: None  RESPIRATORY DATA: Apnea hypopnea index (AHI) 10.4 per hour. 55 total events scored including 8 obstructive apneas and 47 hypopneas. All events were non-supine. REM AHI 59 per hour. There were not enough early events to qualify for split protocol CPAP titration.  OXYGEN DATA: Loud snoring with oxygen desaturation to a nadir of 82% and mean oxygen saturation through the study of 95.9% on room air.  CARDIAC DATA: Sinus rhythm with frequent PVCs  MOVEMENT/PARASOMNIA: Occasional limb jerks with little effect on sleep. No bathroom trips.  IMPRESSION/ RECOMMENDATION:   1) Mild obstructive sleep apnea/hypopnea syndrome, AHI 10.4 per hour with nonsupine events. REM AHI 59 per hour. Loud snoring with oxygen desaturation to a nadir of 82% and mean oxygen saturation through the study of 95.9% on room air.  2) There were not enough early events to permit application of split CPAP titration. This patient can return for dedicated CPAP titration study if appropriate.  Signed Jetty Duhamellinton Caraline Deutschman M.D. Waymon BudgeYOUNG,Nicolaus Andel D Diplomate, American Board of Sleep Medicine  ELECTRONICALLY SIGNED ON:  10/21/2013, 11:21  AM [facility] PH: [phone]   FX: [phone] ACCREDITED BY THE AMERICAN ACADEMY OF SLEEP MEDICINE

## 2013-10-24 ENCOUNTER — Encounter: Payer: BC Managed Care – PPO | Attending: Obstetrics and Gynecology | Admitting: Dietician

## 2013-10-24 ENCOUNTER — Encounter: Payer: Self-pay | Admitting: Dietician

## 2013-10-24 VITALS — Ht 62.0 in | Wt 241.0 lb

## 2013-10-24 DIAGNOSIS — Z6841 Body Mass Index (BMI) 40.0 and over, adult: Secondary | ICD-10-CM

## 2013-10-24 DIAGNOSIS — Z713 Dietary counseling and surveillance: Secondary | ICD-10-CM | POA: Insufficient documentation

## 2013-10-24 NOTE — Patient Instructions (Signed)
Patient to call the [facility] to enroll in Pre-Op and Post-Op Nutrition Education when surgery date is scheduled. 

## 2013-10-24 NOTE — Progress Notes (Signed)
  Pre-Op Assessment Visit:  Pre-Operative RYGB Surgery  Medical Nutrition Therapy:  Appt start time: 1145   End time:  1230.  Patient was seen on 10/24/2013 for Pre-Operative RYGB Nutrition Assessment. Assessment and [facility] of [facility] coordinator on 10/24/2013.   Preferred Learning Style:  No preference indicated   Learning Readiness:   Ready   Handouts given during visit include:  Pre-Op Goals Bariatric Surgery Protein Shakes   Supplement Samples given during visit:  Premier Protein Shake (Strawberry): Lot #: [id number] Exp: 12/15  Bariactiv MVI (2): Lot#: [id number] S Exp: 12/2014  Bariactiv Calcium (2): Lot#: [id number] S Exp: 12/2014  Teaching Method Utilized:  Visual Auditory Hands on  Barriers to learning/adherence to lifestyle change: none  Demonstrated degree of understanding via:  Teach Back   Patient to call the [facility] to enroll in Pre-Op and Post-Op Nutrition Education when surgery date is scheduled.

## 2013-10-26 ENCOUNTER — Encounter (HOSPITAL_BASED_OUTPATIENT_CLINIC_OR_DEPARTMENT_OTHER): Payer: BC Managed Care – PPO

## 2013-11-06 ENCOUNTER — Ambulatory Visit (INDEPENDENT_AMBULATORY_CARE_PROVIDER_SITE_OTHER): Payer: BC Managed Care – PPO | Admitting: Pulmonary Disease

## 2013-11-06 ENCOUNTER — Encounter: Payer: Self-pay | Admitting: Pulmonary Disease

## 2013-11-06 VITALS — BP 120/72 | HR 91 | Temp 98.0°F | Ht 62.0 in | Wt 242.2 lb

## 2013-11-06 DIAGNOSIS — G4733 Obstructive sleep apnea (adult) (pediatric): Secondary | ICD-10-CM | POA: Insufficient documentation

## 2013-11-06 NOTE — Assessment & Plan Note (Signed)
The patient has only mild sleep apnea by her recent sleep study, and feels that she sleeps well at night and no significant sleepiness during the day or evening. I have reviewed her study with her in detail, as well as the pathophysiology of sleep apnea. Given the mild nature, this really is not a significant health risk for her, and since she is asymptomatic at this time, I do not feel compelled to push her toward treatment with CPAP. However, if her surgeon feels strongly that she would benefit from treatment prior to surgery, I would be happy to talk with her again about this. She understands that she may require CPAP transiently in the postop period after anesthesia and pain medication. I have encouraged her to work aggressively on weight loss.

## 2013-11-06 NOTE — Patient Instructions (Signed)
You have mild sleep apnea that is not a health risk for you currently.  Because you are not very symptomatic at this time, will hold off on recommending cpap since this will resolve very quickly once your weight starts decreasing after bariatric surgery.  If you feel you are more symptomatic than what we discussed, let me know. You may require cpap short term in the post operative period if you have breathing issues related to anesthesia and pain medications.  This can be provided for you in the hospital.

## 2013-11-06 NOTE — Progress Notes (Signed)
   Subjective:    Patient ID: Renee Bates, female    DOB: 12/07/1980, 33 y.o.   MRN: [id number]  HPI The patient is a 33 year old female who I've been asked to see for management of obstructive sleep apnea. She underwent a recent sleep study which showed mild OSA, with an AHI of 10 events per hour. She did have an increased REM AHI, but only desaturation as low as 82%. The patient is being evaluated for possible bariatric surgery. She has been noted to have loud snoring, but no one has mentioned witnessed apneas during sleep. She does not have frequent awakenings at night, and feels rested in the mornings upon arising. She stays very active at work, but denies inappropriate daytime sleepiness. She has no issues watching television or movies in the evening, and denies any sleepiness with driving. The patient states that her weight is up 15 pounds over the last 2 years, and her Epworth score today is 7.   Review of Systems  Constitutional: Negative for fever and unexpected weight change.  HENT: Negative for congestion, dental problem, ear pain, nosebleeds, postnasal drip, rhinorrhea, sinus pressure, sneezing, sore throat and trouble swallowing.   Eyes: Negative for redness and itching.  Respiratory: Positive for shortness of breath. Negative for cough, chest tightness and wheezing.   Cardiovascular: Negative for palpitations and leg swelling.  Gastrointestinal: Negative for nausea and vomiting.  Genitourinary: Negative for dysuria.  Musculoskeletal: Negative for joint swelling.  Skin: Negative for rash.  Neurological: Negative for headaches.  Hematological: Does not bruise/bleed easily.  Psychiatric/Behavioral: Negative for dysphoric mood. The patient is not nervous/anxious.        Objective:   Physical Exam Constitutional:  Obese female, no acute distress  HENT:  Nares patent without discharge, enlarged turbinates noted.   Oropharynx without exudate, palate and uvula are moderately  elongated, small OP  Eyes:  Perrla, eomi, no scleral icterus  Neck:  No JVD, no TMG  Cardiovascular:  Normal rate, regular rhythm, no rubs or gallops.  No murmurs        Intact distal pulses  Pulmonary :  Normal breath sounds, no stridor or respiratory distress   No rales, rhonchi, or wheezing  Abdominal:  Soft, nondistended, bowel sounds present.  No tenderness noted.   Musculoskeletal:  mild lower extremity edema noted.  Lymph Nodes:  No cervical lymphadenopathy noted  Skin:  No cyanosis noted  Neurologic:  Alert, appropriate, moves all 4 extremities without obvious deficit.         Assessment & Plan:

## 2013-12-01 NOTE — Progress Notes (Signed)
Surgery on 12/25/13.  Need orders in EPIC.  Thank You.  

## 2013-12-07 ENCOUNTER — Ambulatory Visit (INDEPENDENT_AMBULATORY_CARE_PROVIDER_SITE_OTHER): Payer: BC Managed Care – PPO | Admitting: Obstetrics & Gynecology

## 2013-12-07 ENCOUNTER — Encounter: Payer: Self-pay | Admitting: Obstetrics & Gynecology

## 2013-12-07 VITALS — BP 130/80 | Ht 61.0 in | Wt 243.0 lb

## 2013-12-07 DIAGNOSIS — N949 Unspecified condition associated with female genital organs and menstrual cycle: Secondary | ICD-10-CM

## 2013-12-07 DIAGNOSIS — N938 Other specified abnormal uterine and vaginal bleeding: Secondary | ICD-10-CM | POA: Insufficient documentation

## 2013-12-07 DIAGNOSIS — N925 Other specified irregular menstruation: Secondary | ICD-10-CM

## 2013-12-07 MED ORDER — MEGESTROL ACETATE 40 MG PO TABS: 40.0000 mg | ORAL_TABLET | Freq: Every day | ORAL | Status: DC

## 2013-12-07 NOTE — Addendum Note (Signed)
Addended by: Lazaro ArmsEURE, Kimblery Diop H on: 12/07/2013 10:22 AM   Modules accepted: Orders

## 2013-12-07 NOTE — Progress Notes (Signed)
Patient ID: Renee Bates, female   DOB: 12/23/1980, 33 y.o.   MRN: [id number] Pt did not keep her follow up evaluations scheduled in January  She is doing well on megestrol 40 daily, no bleeding, very light pink spotting  Is scheduled for gastric bypass on 12/25/2013  Pelvic sonogram prior to evaluate the endometrium  Past Medical History  Diagnosis Date  . Hypertension   . Obesity   . Sleep apnea     Past Surgical History  Procedure Laterality Date  . Cesarean section    . Tubal ligation    . Breath tek h pylori N/A 10/13/2013    Procedure: BREATH TEK H PYLORI;  Surgeon: Valarie MerinoMatthew B Martin, MD;  [facility];  Service: General;  Laterality: N/A;    OB History   Grav Para Term Preterm Abortions TAB SAB Ect Mult Living                  No Known Allergies  History   Social History  . Marital Status: Single    Spouse Name: N/A    Number of Children: N/A  . Years of Education: N/A   Occupational History  . cashier    Social History Main Topics  . Smoking status: Never Smoker   . Smokeless tobacco: None  . Alcohol Use: Yes     Comment: occasionally  . Drug Use: No  . Sexual Activity: No   Other Topics Concern  . None   Social History Narrative  . None    Family History  Problem Relation Age of Onset  . Diabetes Mother   . Hypertension Mother   . Asthma Son   . Diabetes Father   . Hypertension Father   . Hypertension Sister   . Hypertension Sister

## 2013-12-08 ENCOUNTER — Encounter (HOSPITAL_COMMUNITY): Payer: Self-pay | Admitting: Pharmacy Technician

## 2013-12-08 NOTE — Progress Notes (Signed)
Need orders please - pt coming for preop 12/15/13 - thank you

## 2013-12-11 ENCOUNTER — Encounter: Payer: BC Managed Care – PPO | Attending: Obstetrics and Gynecology

## 2013-12-11 VITALS — Ht 62.0 in | Wt 244.0 lb

## 2013-12-11 DIAGNOSIS — Z713 Dietary counseling and surveillance: Secondary | ICD-10-CM | POA: Insufficient documentation

## 2013-12-11 DIAGNOSIS — Z6841 Body Mass Index (BMI) 40.0 and over, adult: Secondary | ICD-10-CM

## 2013-12-12 NOTE — Progress Notes (Signed)
  Pre-Operative Nutrition Class:  Appt start time: 8366   End time:  1930.  Patient was seen on 12/11/2013 for Pre-Operative Bariatric Surgery Education at [facility].   Surgery date: 12/25/2013 Surgery type: RYGB [facility] weight at [facility]: 241 lbs on 10/24/2013 Weight today: 244 lbs  TANITA  BODY COMP RESULTS  12/11/13   BMI (kg/m^2) 44.6   Fat Mass (lbs) 120.5   Fat Free Mass (lbs) 123.5   Total Body Water (lbs) 90.5   Samples given per MNT protocol. Patient educated on appropriate usage: Premier protein shake (Qty: 1) Chocolate Lot #: [id number] Exp: 09/2014  Bariactiv Multivitamin (Qty: 1)  Lot #: [id number] S Exp: 12/2014  Unjury protein powder (Qty: 1) Chicken soup flavor Lot #: [id number] Exp: 07/2014  Bariatric Advantage Calcium Citrate (Qty 1 - cherry) Lot #: [id number] Exp: 05/2014   The following the learning objectives were met by the patient during this course:  Identify Pre-Op Dietary Goals and will begin 2 weeks pre-operatively  Identify appropriate sources of fluids and proteins   State protein recommendations and appropriate sources pre and post-operatively  Identify Post-Operative Dietary Goals and will follow for 2 weeks post-operatively  Identify appropriate multivitamin and calcium sources  Describe the need for physical activity post-operatively and will follow MD recommendations  State when to call healthcare provider regarding medication questions or post-operative complications  Handouts given during class include:  Pre-Op Bariatric Surgery Diet Handout  Protein Shake Handout  Post-Op Bariatric Surgery Nutrition Handout  BELT Program Information Flyer  Support Group Information Flyer  [facility] Bariatric Supplements Price List  Follow-Up Plan: Patient will [facility] 2 weeks post operatively for diet advancement per MD.

## 2013-12-14 ENCOUNTER — Other Ambulatory Visit: Payer: BC Managed Care – PPO

## 2013-12-14 ENCOUNTER — Ambulatory Visit: Payer: BC Managed Care – PPO | Admitting: Obstetrics & Gynecology

## 2013-12-14 ENCOUNTER — Other Ambulatory Visit (INDEPENDENT_AMBULATORY_CARE_PROVIDER_SITE_OTHER): Payer: Self-pay | Admitting: Surgery

## 2013-12-15 ENCOUNTER — Encounter (HOSPITAL_COMMUNITY)
Admission: RE | Admit: 2013-12-15 | Discharge: 2013-12-15 | Disposition: A | Discharge status: Home / Self Care | Payer: BC Managed Care – PPO | Source: Ambulatory Visit | Attending: Surgery | Admitting: Surgery

## 2013-12-15 ENCOUNTER — Encounter (HOSPITAL_COMMUNITY): Payer: Self-pay

## 2013-12-15 DIAGNOSIS — Z01812 Encounter for preprocedural laboratory examination: Secondary | ICD-10-CM | POA: Insufficient documentation

## 2013-12-15 LAB — COMPREHENSIVE METABOLIC PANEL
ALT: 13 U/L (ref 0–35)
AST: 16 U/L (ref 0–37)
Albumin: 3.8 g/dL (ref 3.5–5.2)
Alkaline Phosphatase: 74 U/L (ref 39–117)
BUN: 18 mg/dL (ref 6–23)
CO2: 23 mEq/L (ref 19–32)
Calcium: 9.7 mg/dL (ref 8.4–10.5)
Chloride: 98 mEq/L (ref 96–112)
Creatinine, Ser: 0.67 mg/dL (ref 0.50–1.10)
GFR calc Af Amer: >90 mL/min (ref >90)
GFR calc non Af Amer: >90 mL/min (ref >90)
Glucose, Bld: 83 mg/dL (ref 70–99)
Potassium: 3.7 mEq/L (ref 3.7–5.3)
Sodium: 135 mEq/L — ABNORMAL LOW (ref 137–147)
Total Bilirubin: 0.4 mg/dL (ref 0.3–1.2)
Total Protein: 8 g/dL (ref 6.0–8.3)

## 2013-12-15 LAB — CBC WITH DIFFERENTIAL/PLATELET
Basophils Absolute: 0 10*3/uL (ref 0.0–0.1)
Basophils Relative: 1 % (ref 0–1)
Eosinophils Absolute: 0.1 10*3/uL (ref 0.0–0.7)
Eosinophils Relative: 2 % (ref 0–5)
HCT: 38.4 % (ref 36.0–46.0)
Hemoglobin: 13.2 g/dL (ref 12.0–15.0)
Lymphocytes Relative: 45 % (ref 12–46)
Lymphs Abs: 3.2 10*3/uL (ref 0.7–4.0)
MCH: 29.9 pg (ref 26.0–34.0)
MCHC: 34.4 g/dL (ref 30.0–36.0)
MCV: 86.9 fL (ref 78.0–100.0)
Monocytes Absolute: 0.5 10*3/uL (ref 0.1–1.0)
Monocytes Relative: 7 % (ref 3–12)
Neutro Abs: 3.2 10*3/uL (ref 1.7–7.7)
Neutrophils Relative %: 45 % (ref 43–77)
Platelets: 473 10*3/uL — ABNORMAL HIGH (ref 150–400)
RBC: 4.42 MIL/uL (ref 3.87–5.11)
RDW: 13.8 % (ref 11.5–15.5)
WBC: 7.2 10*3/uL (ref 4.0–10.5)

## 2013-12-15 LAB — HCG, SERUM, QUALITATIVE: Preg, Serum: NEGATIVE

## 2013-12-15 NOTE — Progress Notes (Signed)
Chest x-ray 09/28/13 on EPIC, EKG 09/28/13 on EPIC

## 2013-12-15 NOTE — Patient Instructions (Addendum)
20 Konrad PentaDoris Harrison  12/15/2013   Your procedure is scheduled on: 12/25/13  [facility] 5:15 AM.  Call this number if you have problems the morning of surgery [phone]: [phone]   Remember:   Do not eat food or drink liquids After Midnight.     Do not wear jewelry, make-up or nail polish.  Do not wear lotions, powders, or perfumes. You may wear deodorant.  Do not shave 48 hours prior to surgery. Men may shave face and neck.  Do not bring valuables to the hospital.  Contacts, dentures or bridgework may not be worn into surgery.  Leave suitcase in the car. After surgery it may be brought to your room.  For patients admitted to the hospital, checkout time is 11:00 AM the day of discharge.   Birdie Sonsachel Calvert Charland, RN  pre op nurse call if needed [phone]    [facility] Pre[facility] Before surgery, you can play an important role.  Because skin is not sterile, your skin needs to be as free of germs as possible.  You can reduce the number of germs on your skin by washing with CHG (chlorahexidine gluconate) soap before surgery.  CHG is an antiseptic cleaner which kills germs and bonds with the skin to continue killing germs even after washing. Please DO NOT use if you have an allergy to CHG or antibacterial soaps.  If your skin becomes reddened/irritated stop using the CHG and inform your nurse when you arrive at Short Stay. Do not shave (including legs and underarms) for at least 48 hours prior to the first CHG shower.  You may shave your face. Please follow these instructions carefully:  1.  Shower with CHG Soap the night before surgery and the  morning of Surgery.  2.  If you choose to wash your hair, wash your hair first as usual with your  normal  shampoo.  3.  After you shampoo, rinse your hair and body thoroughly to remove the  shampoo.                            4.  Use CHG as you would any other liquid soap.  You can apply chg directly  to the skin and  wash                       Gently with a scrungie or clean washcloth.  5.  Apply the CHG Soap to your body ONLY FROM THE NECK DOWN.   Do not use on open                           Wound or open sores. Avoid contact with eyes, ears mouth and genitals (private parts).                        Genitals (private parts) with your normal soap.             6.  Wash thoroughly, paying special attention to the area where your surgery  will be performed.  7.  Thoroughly rinse your body with warm water from the neck down.  8.  DO NOT shower/wash with your normal soap after using and rinsing off  the CHG Soap.                9.  Dennie BiblePat  yourself dry with a clean towel.            10.  Wear clean pajamas.            11.  Place clean sheets on your bed the night of your first shower and do not  sleep with pets. Day of Surgery : Do not apply any lotions/deodorants the morning of surgery.  Please wear clean clothes to the hospital/surgery center.  FAILURE TO FOLLOW THESE INSTRUCTIONS MAY RESULT IN THE CANCELLATION OF YOUR SURGERY PATIENT SIGNATURE_________________________________  NURSE SIGNATURE__________________________________  ________________________________________________________________________

## 2013-12-22 ENCOUNTER — Ambulatory Visit (INDEPENDENT_AMBULATORY_CARE_PROVIDER_SITE_OTHER): Payer: BC Managed Care – PPO | Admitting: Surgery

## 2013-12-22 ENCOUNTER — Encounter (INDEPENDENT_AMBULATORY_CARE_PROVIDER_SITE_OTHER): Payer: Self-pay | Admitting: Surgery

## 2013-12-22 VITALS — BP 120/86 | HR 68 | Temp 98.6°F | Resp 16 | Ht 62.0 in | Wt 237.2 lb

## 2013-12-22 DIAGNOSIS — Z6841 Body Mass Index (BMI) 40.0 and over, adult: Secondary | ICD-10-CM

## 2013-12-22 MED ORDER — HYDROCODONE-ACETAMINOPHEN 7.5-325 MG/15ML PO SOLN
15.0000 mL | ORAL | Status: DC | PRN
Start: 2013-12-22 — End: 2014-01-10

## 2013-12-22 NOTE — Patient Instructions (Signed)

## 2013-12-22 NOTE — Progress Notes (Signed)
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 She currently has a BMI of 42. She is a nonsmoker, non-aspirin taking young woman with hypertension and he does not have diabetes. She has watched a video send a lot of work and study and has decided she would like to have a gastric bypass. I discussed that with her in some detail and she is aware of the procedure and is eager to get started on a bariatric journey.  She denies any history of DVT problems in her legs. She has had 2 prior C-sections.   UGI showed no hiatus hernia and ultrasound showed no gallstones.  Past Medical History   Diagnosis  Date   .  Hypertension     Past Surgical History   Procedure  Laterality  Date   .  Cesarean section     .  Tubal ligation      Current Outpatient Prescriptions   Medication  Sig  Dispense  Refill   .  hydrochlorothiazide (HYDRODIURIL) 25 MG tablet  Take 25 mg by mouth daily.     .  megestrol (MEGACE) 40 MG tablet  Take 1 tablet (40 mg total) by mouth daily.  30 tablet  3   .  acetaminophen (TYLENOL) 500 MG tablet  Take 1,000 mg by mouth every 6 (six) hours as needed. For pain      No current facility-administered medications for this visit.   Review of patient's allergies indicates no known allergies.  No family history on file.  Social History: reports that she has never smoked. She does not have any smokeless tobacco history on file. She reports that she drinks alcohol. She reports that she does not use illicit drugs.  REVIEW OF SYSTEMS - PERTINENT POSITIVES ONLY:  Negative except for issues referable to her hypertension and obesity.  Physical Exam:  Blood pressure 118/82, pulse 72, resp. rate 14, height 5' 2" (1.575 m), weight 232 lb 6.4 oz (105.416 kg), last menstrual period 08/18/2013.  Body mass index is 42.5 kg/(m^2).  Gen: WDWN  African American female NAD  Neurological: Alert and oriented to person, place, and time. Motor and sensory function is grossly intact  Head: Normocephalic and atraumatic.  Eyes: Conjunctivae are normal. Pupils are equal, round, and reactive to light. No scleral icterus.  Neck: Normal range of motion. Neck supple. No tracheal deviation or thyromegaly present.  Cardiovascular: SR without murmurs or gallops. No carotid bruits  Respiratory: Effort normal. No respiratory distress. No chest wall tenderness. Breath sounds normal. No wheezes, rales or rhonchi.  Abdomen: Obese and nontender  GU:  Musculoskeletal: Normal range of motion. Extremities are nontender. No cyanosis, edema or clubbing noted Lymphadenopathy: No cervical, preauricular, postauricular or axillary adenopathy is present Skin: Skin is warm and dry. No rash noted. No diaphoresis. No erythema. No pallor. Pscyh: Normal mood and affect. Behavior is normal. Judgment and thought content normal.  LABORATORY RESULTS:  No results found for this or any previous visit (from the past 48 hour(s)).  RADIOLOGY RESULTS:  No results found.  Problem List:  Patient Active Problem List    Diagnosis  Date Noted   .  Hypertension  09/21/2013   .  Morbid obesity with body mass index of 40.0-44.9 in adult  09/21/2013   Assessment & Plan:  Morbid obesity BMI 42 for consideration   for laparoscopic Roux-en-Y gastric bypass.  Matt B. Daphine DeutscherMartin, MD, [facility]  [facility]  [phone] beeper  [phone]

## 2013-12-25 ENCOUNTER — Encounter (HOSPITAL_COMMUNITY)
Admission: RE | Disposition: A | Discharge status: Home / Self Care | Payer: Self-pay | Source: Ambulatory Visit | Attending: Surgery

## 2013-12-25 ENCOUNTER — Encounter (HOSPITAL_COMMUNITY): Payer: Self-pay | Admitting: *Deleted

## 2013-12-25 ENCOUNTER — Inpatient Hospital Stay (HOSPITAL_COMMUNITY): Payer: BC Managed Care – PPO | Admitting: Certified Registered Nurse Anesthetist

## 2013-12-25 ENCOUNTER — Encounter (HOSPITAL_COMMUNITY): Payer: BC Managed Care – PPO | Admitting: Certified Registered Nurse Anesthetist

## 2013-12-25 ENCOUNTER — Inpatient Hospital Stay (HOSPITAL_COMMUNITY)
Admission: RE | Admit: 2013-12-25 | Discharge: 2013-12-28 | DRG: 621 | Disposition: A | Discharge status: Home / Self Care | Payer: BC Managed Care – PPO | Source: Ambulatory Visit | Attending: Surgery | Admitting: Surgery

## 2013-12-25 DIAGNOSIS — Z6841 Body Mass Index (BMI) 40.0 and over, adult: Secondary | ICD-10-CM

## 2013-12-25 DIAGNOSIS — Z79899 Other long term (current) drug therapy: Secondary | ICD-10-CM

## 2013-12-25 DIAGNOSIS — Z9884 Bariatric surgery status: Secondary | ICD-10-CM

## 2013-12-25 DIAGNOSIS — I10 Essential (primary) hypertension: Secondary | ICD-10-CM

## 2013-12-25 DIAGNOSIS — R51 Headache: Secondary | ICD-10-CM | POA: Diagnosis not present

## 2013-12-25 DIAGNOSIS — G4733 Obstructive sleep apnea (adult) (pediatric): Secondary | ICD-10-CM | POA: Diagnosis present

## 2013-12-25 HISTORY — PX: GASTRIC ROUX-EN-Y: SHX5262

## 2013-12-25 LAB — CBC
HCT: 34 % — ABNORMAL LOW (ref 36.0–46.0)
Hemoglobin: 11.6 g/dL — ABNORMAL LOW (ref 12.0–15.0)
MCH: 29.4 pg (ref 26.0–34.0)
MCHC: 34.1 g/dL (ref 30.0–36.0)
MCV: 86.3 fL (ref 78.0–100.0)
Platelets: 430 10*3/uL — ABNORMAL HIGH (ref 150–400)
RBC: 3.94 MIL/uL (ref 3.87–5.11)
RDW: 13.3 % (ref 11.5–15.5)
WBC: 14.8 10*3/uL — ABNORMAL HIGH (ref 4.0–10.5)

## 2013-12-25 LAB — PREGNANCY, URINE: Preg Test, Ur: NEGATIVE

## 2013-12-25 LAB — CREATININE, SERUM
Creatinine, Ser: 0.73 mg/dL (ref 0.50–1.10)
GFR calc Af Amer: >90 mL/min (ref >90)
GFR calc non Af Amer: >90 mL/min (ref >90)

## 2013-12-25 SURGERY — LAPAROSCOPIC ROUX-EN-Y GASTRIC BYPASS WITH UPPER ENDOSCOPY
Anesthesia: General | Site: Abdomen

## 2013-12-25 MED ORDER — MORPHINE SULFATE 2 MG/ML IJ SOLN
INTRAMUSCULAR | Status: AC
  Filled 2013-12-25: qty 1

## 2013-12-25 MED ORDER — KCL IN DEXTROSE-NACL 20-5-0.45 MEQ/L-%-% IV SOLN
INTRAVENOUS | Status: DC
  Administered 2013-12-25 – 2013-12-27 (×5): via INTRAVENOUS
  Filled 2013-12-25 (×9): qty 1000

## 2013-12-25 MED ORDER — MORPHINE SULFATE 2 MG/ML IJ SOLN
2.0000 mg | INTRAMUSCULAR | Status: DC | PRN
  Administered 2013-12-26 (×3): 4 mg via INTRAVENOUS
  Administered 2013-12-27 (×2): 2 mg via INTRAVENOUS
  Filled 2013-12-25: qty 1
  Filled 2013-12-25 (×3): qty 2
  Filled 2013-12-25: qty 1

## 2013-12-25 MED ORDER — PROMETHAZINE HCL 25 MG/ML IJ SOLN: 6.2500 mg | INTRAMUSCULAR | Status: DC | PRN

## 2013-12-25 MED ORDER — HEPARIN SODIUM (PORCINE) 5000 UNIT/ML IJ SOLN
5000.0000 [IU] | INTRAMUSCULAR | Status: AC
  Administered 2013-12-25: 5000 [IU] via SUBCUTANEOUS
  Filled 2013-12-25: qty 1

## 2013-12-25 MED ORDER — PROPOFOL 10 MG/ML IV BOLUS
INTRAVENOUS | Status: DC | PRN
  Administered 2013-12-25: 200 mg via INTRAVENOUS

## 2013-12-25 MED ORDER — MORPHINE SULFATE 4 MG/ML IJ SOLN
INTRAMUSCULAR | Status: AC
  Filled 2013-12-25: qty 1

## 2013-12-25 MED ORDER — METOCLOPRAMIDE HCL 5 MG/ML IJ SOLN
INTRAMUSCULAR | Status: DC | PRN
  Administered 2013-12-25: 10 mg via INTRAVENOUS

## 2013-12-25 MED ORDER — NEOSTIGMINE METHYLSULFATE 10 MG/10ML IV SOLN
INTRAVENOUS | Status: AC
  Filled 2013-12-25: qty 1

## 2013-12-25 MED ORDER — FENTANYL CITRATE 0.05 MG/ML IJ SOLN
INTRAMUSCULAR | Status: AC
  Filled 2013-12-25: qty 5

## 2013-12-25 MED ORDER — GLYCOPYRROLATE 0.2 MG/ML IJ SOLN
INTRAMUSCULAR | Status: AC
  Filled 2013-12-25: qty 3

## 2013-12-25 MED ORDER — UNJURY CHICKEN SOUP POWDER
2.0000 [oz_av] | Freq: Four times a day (QID) | ORAL | Status: DC
  Administered 2013-12-27 – 2013-12-28 (×2): 2 [oz_av] via ORAL

## 2013-12-25 MED ORDER — ONDANSETRON HCL 4 MG/2ML IJ SOLN
INTRAMUSCULAR | Status: AC
  Filled 2013-12-25: qty 2

## 2013-12-25 MED ORDER — ACETAMINOPHEN 160 MG/5ML PO SOLN
650.0000 mg | ORAL | Status: DC | PRN
  Administered 2013-12-27 – 2013-12-28 (×3): 650 mg via ORAL
  Filled 2013-12-25 (×4): qty 20.3

## 2013-12-25 MED ORDER — DEXTROSE 5 % IV SOLN
2.0000 g | INTRAVENOUS | Status: AC
  Administered 2013-12-25: 2 g via INTRAVENOUS

## 2013-12-25 MED ORDER — ROCURONIUM BROMIDE 100 MG/10ML IV SOLN
INTRAVENOUS | Status: DC | PRN
  Administered 2013-12-25 (×2): 10 mg via INTRAVENOUS
  Administered 2013-12-25: 50 mg via INTRAVENOUS

## 2013-12-25 MED ORDER — SUCCINYLCHOLINE CHLORIDE 20 MG/ML IJ SOLN
INTRAMUSCULAR | Status: DC | PRN
  Administered 2013-12-25: 100 mg via INTRAVENOUS

## 2013-12-25 MED ORDER — DEXAMETHASONE SODIUM PHOSPHATE 10 MG/ML IJ SOLN
INTRAMUSCULAR | Status: AC
  Filled 2013-12-25: qty 1

## 2013-12-25 MED ORDER — ONDANSETRON HCL 4 MG/2ML IJ SOLN
4.0000 mg | INTRAMUSCULAR | Status: DC | PRN
  Administered 2013-12-25 – 2013-12-26 (×7): 4 mg via INTRAVENOUS
  Filled 2013-12-25 (×7): qty 2

## 2013-12-25 MED ORDER — FENTANYL CITRATE 0.05 MG/ML IJ SOLN
INTRAMUSCULAR | Status: DC | PRN
  Administered 2013-12-25: 50 ug via INTRAVENOUS
  Administered 2013-12-25 (×2): 100 ug via INTRAVENOUS
  Administered 2013-12-25: 150 ug via INTRAVENOUS
  Administered 2013-12-25: 100 ug via INTRAVENOUS

## 2013-12-25 MED ORDER — CHLORHEXIDINE GLUCONATE CLOTH 2 % EX PADS: 6.0000 | MEDICATED_PAD | Freq: Once | CUTANEOUS | Status: DC

## 2013-12-25 MED ORDER — UNJURY CHOCOLATE CLASSIC POWDER: 2.0000 [oz_av] | Freq: Four times a day (QID) | ORAL | Status: DC

## 2013-12-25 MED ORDER — LIDOCAINE HCL (CARDIAC) 20 MG/ML IV SOLN
INTRAVENOUS | Status: DC | PRN
  Administered 2013-12-25: 60 mg via INTRAVENOUS

## 2013-12-25 MED ORDER — MIDAZOLAM HCL 2 MG/2ML IJ SOLN
INTRAMUSCULAR | Status: AC
  Filled 2013-12-25: qty 2

## 2013-12-25 MED ORDER — MIDAZOLAM HCL 5 MG/5ML IJ SOLN
INTRAMUSCULAR | Status: DC | PRN
  Administered 2013-12-25: 2 mg via INTRAVENOUS

## 2013-12-25 MED ORDER — ACETAMINOPHEN 160 MG/5ML PO SOLN
325.0000 mg | ORAL | Status: DC | PRN
  Filled 2013-12-25: qty 20.3

## 2013-12-25 MED ORDER — HEPARIN SODIUM (PORCINE) 5000 UNIT/ML IJ SOLN
5000.0000 [IU] | Freq: Three times a day (TID) | INTRAMUSCULAR | Status: DC
  Administered 2013-12-25 – 2013-12-28 (×8): 5000 [IU] via SUBCUTANEOUS
  Filled 2013-12-25 (×11): qty 1

## 2013-12-25 MED ORDER — DEXTROSE 5 % IV SOLN
INTRAVENOUS | Status: AC
  Filled 2013-12-25: qty 2

## 2013-12-25 MED ORDER — DEXAMETHASONE SODIUM PHOSPHATE 10 MG/ML IJ SOLN
INTRAMUSCULAR | Status: DC | PRN
  Administered 2013-12-25: 10 mg via INTRAVENOUS

## 2013-12-25 MED ORDER — LACTATED RINGERS IR SOLN
Status: DC | PRN
Start: 2013-12-25 — End: 2013-12-25
  Administered 2013-12-25: 1000 mL

## 2013-12-25 MED ORDER — METOCLOPRAMIDE HCL 5 MG/ML IJ SOLN
INTRAMUSCULAR | Status: AC
  Filled 2013-12-25: qty 2

## 2013-12-25 MED ORDER — MEPERIDINE HCL 50 MG/ML IJ SOLN
6.2500 mg | INTRAMUSCULAR | Status: DC | PRN
Start: 2013-12-25 — End: 2013-12-25

## 2013-12-25 MED ORDER — PROPOFOL 10 MG/ML IV BOLUS
INTRAVENOUS | Status: AC
  Filled 2013-12-25: qty 20

## 2013-12-25 MED ORDER — UNJURY VANILLA POWDER
2.0000 [oz_av] | Freq: Four times a day (QID) | ORAL | Status: DC
  Administered 2013-12-27 (×2): 2 [oz_av] via ORAL

## 2013-12-25 MED ORDER — MORPHINE SULFATE 10 MG/ML IJ SOLN
2.0000 mg | INTRAMUSCULAR | Status: DC | PRN
  Administered 2013-12-25: 4 mg via INTRAVENOUS
  Administered 2013-12-25 (×2): 2 mg via INTRAVENOUS
  Filled 2013-12-25: qty 1

## 2013-12-25 MED ORDER — GLYCOPYRROLATE 0.2 MG/ML IJ SOLN
INTRAMUSCULAR | Status: DC | PRN
  Administered 2013-12-25: 0.6 mg via INTRAVENOUS

## 2013-12-25 MED ORDER — ONDANSETRON HCL 4 MG/2ML IJ SOLN
INTRAMUSCULAR | Status: DC | PRN
  Administered 2013-12-25: 4 mg via INTRAVENOUS

## 2013-12-25 MED ORDER — LACTATED RINGERS IV SOLN
INTRAVENOUS | Status: DC | PRN
  Administered 2013-12-25 (×2): via INTRAVENOUS

## 2013-12-25 MED ORDER — NEOSTIGMINE METHYLSULFATE 10 MG/10ML IV SOLN
INTRAVENOUS | Status: DC | PRN
  Administered 2013-12-25: 4 mg via INTRAVENOUS

## 2013-12-25 MED ORDER — FENTANYL CITRATE 0.05 MG/ML IJ SOLN: 25.0000 ug | INTRAMUSCULAR | Status: DC | PRN

## 2013-12-25 MED ORDER — TISSEEL VH 10 ML EX KIT
PACK | CUTANEOUS | Status: AC
  Filled 2013-12-25: qty 2

## 2013-12-25 MED ORDER — PROMETHAZINE HCL 25 MG/ML IJ SOLN: 12.5000 mg | Freq: Four times a day (QID) | INTRAMUSCULAR | Status: DC | PRN

## 2013-12-25 MED ORDER — OXYCODONE HCL 5 MG/5ML PO SOLN
5.0000 mg | ORAL | Status: DC | PRN
  Administered 2013-12-26 – 2013-12-27 (×2): 10 mg via ORAL
  Filled 2013-12-25 (×2): qty 50

## 2013-12-25 MED ORDER — LACTATED RINGERS IV SOLN
INTRAVENOUS | Status: DC
  Administered 2013-12-25: 12:00:00 via INTRAVENOUS

## 2013-12-25 MED ORDER — BUPIVACAINE LIPOSOME 1.3 % IJ SUSP
20.0000 mL | Freq: Once | INTRAMUSCULAR | Status: AC
  Administered 2013-12-25: 20 mL
  Filled 2013-12-25: qty 20

## 2013-12-25 SURGICAL SUPPLY — 62 items
APPLICATOR COTTON TIP 6IN STRL (MISCELLANEOUS) IMPLANT
BENZOIN TINCTURE PRP APPL 2/3 (GAUZE/BANDAGES/DRESSINGS) IMPLANT
BLADE SURG 15 STRL LF DISP TIS (BLADE) ×1 IMPLANT
BLADE SURG 15 STRL SS (BLADE) ×1
CABLE HIGH FREQUENCY MONO STRZ (ELECTRODE) ×2 IMPLANT
CANISTER SUCTION 2500CC (MISCELLANEOUS) ×2 IMPLANT
CLIP SUT LAPRA TY ABSORB (SUTURE) ×2 IMPLANT
DERMABOND ADVANCED (GAUZE/BANDAGES/DRESSINGS) ×2
DERMABOND ADVANCED .7 DNX12 (GAUZE/BANDAGES/DRESSINGS) ×2 IMPLANT
DEVICE SUTURE ENDOST 10MM (ENDOMECHANICALS) ×2 IMPLANT
DISSECTOR BLUNT TIP ENDO 5MM (MISCELLANEOUS) IMPLANT
DRAIN PENROSE 18X1/4 LTX STRL (WOUND CARE) ×2 IMPLANT
DRAPE CAMERA CLOSED 9X96 (DRAPES) ×2 IMPLANT
GAUZE SPONGE 4X4 16PLY XRAY LF (GAUZE/BANDAGES/DRESSINGS) ×2 IMPLANT
GLOVE BIOGEL M 8.0 STRL (GLOVE) ×2 IMPLANT
GOWN STRL REUS W/TWL XL LVL3 (GOWN DISPOSABLE) ×8 IMPLANT
HANDLE STAPLE EGIA 4 XL (STAPLE) ×2 IMPLANT
HOVERMATT SINGLE USE (MISCELLANEOUS) ×2 IMPLANT
KIT BASIN OR (CUSTOM PROCEDURE TRAY) ×2 IMPLANT
KIT GASTRIC LAVAGE 34FR ADT (SET/KITS/TRAYS/PACK) ×2 IMPLANT
MARKER SKIN DUAL TIP RULER LAB (MISCELLANEOUS) ×2 IMPLANT
NEEDLE SPNL 22GX3.5 QUINCKE BK (NEEDLE) ×2 IMPLANT
NS IRRIG 1000ML POUR BTL (IV SOLUTION) ×2 IMPLANT
PACK CARDIOVASCULAR III (CUSTOM PROCEDURE TRAY) ×2 IMPLANT
RELOAD EGIA 45 MED/THCK PURPLE (STAPLE) ×2 IMPLANT
RELOAD EGIA 45 TAN VASC (STAPLE) ×2 IMPLANT
RELOAD EGIA 60 MED/THCK PURPLE (STAPLE) ×4 IMPLANT
RELOAD EGIA 60 TAN VASC (STAPLE) ×2 IMPLANT
RELOAD ENDO STITCH 2.0 (ENDOMECHANICALS) ×9
RELOAD TRI 60 ART MED THCK PUR (STAPLE) ×4 IMPLANT
SCISSORS LAP 5X45 EPIX DISP (ENDOMECHANICALS) ×2 IMPLANT
SEALANT SURGICAL APPL DUAL CAN (MISCELLANEOUS) ×2 IMPLANT
SET IRRIG TUBING LAPAROSCOPIC (IRRIGATION / IRRIGATOR) ×2 IMPLANT
SHEARS CURVED HARMONIC AC 45CM (MISCELLANEOUS) ×2 IMPLANT
SLEEVE ADV FIXATION 12X100MM (TROCAR) ×4 IMPLANT
SLEEVE ADV FIXATION 5X100MM (TROCAR) IMPLANT
SOLUTION ANTI FOG 6CC (MISCELLANEOUS) ×2 IMPLANT
SPONGE GAUZE 4X4 12PLY (GAUZE/BANDAGES/DRESSINGS) IMPLANT
STAPLER VISISTAT 35W (STAPLE) ×2 IMPLANT
STRIP CLOSURE SKIN 1/2X4 (GAUZE/BANDAGES/DRESSINGS) IMPLANT
STRIP PERI DRY VERITAS 45 (STAPLE) IMPLANT
STRIP PERI DRY VERITAS 60 (STAPLE) ×2 IMPLANT
SUT RELOAD ENDO STITCH 2 48X1 (ENDOMECHANICALS) ×5
SUT RELOAD ENDO STITCH 2.0 (ENDOMECHANICALS) ×4
SUT VIC AB 2-0 SH 27 (SUTURE) ×1
SUT VIC AB 2-0 SH 27X BRD (SUTURE) ×1 IMPLANT
SUT VIC AB 4-0 SH 18 (SUTURE) ×2 IMPLANT
SUTURE RELOAD END STTCH 2 48X1 (ENDOMECHANICALS) ×5 IMPLANT
SUTURE RELOAD ENDO STITCH 2.0 (ENDOMECHANICALS) ×4 IMPLANT
SYR 20CC LL (SYRINGE) ×2 IMPLANT
SYR 30ML LL (SYRINGE) ×2 IMPLANT
SYR 50ML LL SCALE MARK (SYRINGE) ×2 IMPLANT
TOWEL OR 17X26 10 PK STRL BLUE (TOWEL DISPOSABLE) ×4 IMPLANT
TOWEL OR NON WOVEN STRL DISP B (DISPOSABLE) ×2 IMPLANT
TRAY FOLEY CATH 14FRSI W/METER (CATHETERS) ×2 IMPLANT
TROCAR ADV FIXATION 12X100MM (TROCAR) ×2 IMPLANT
TROCAR ADV FIXATION 5X100MM (TROCAR) ×2 IMPLANT
TROCAR BLADELESS OPT 5 100 (ENDOMECHANICALS) ×2 IMPLANT
TROCAR XCEL 12X100 BLDLESS (ENDOMECHANICALS) ×2 IMPLANT
TUBING CONNECTING 10 (TUBING) ×2 IMPLANT
TUBING ENDO SMARTCAP PENTAX (MISCELLANEOUS) ×2 IMPLANT
TUBING FILTER THERMOFLATOR (ELECTROSURGICAL) ×2 IMPLANT

## 2013-12-25 NOTE — Op Note (Signed)
Surgeon: Pollyann SavoyMatt B. Daphine DeutscherMartin, MD, FACS Asst:  Ovidio Kinavid Newman, MD, FACS Anesthesia: General endotracheal Drains: None  Procedure: Laparoscopic Roux en Y gastric bypass with 40 cm BP limb and 100 cm Roux limb, antecolic, antegastric, candy cane to the left.  Closure of Peterson's defect. Upper endoscopy.   Description of Procedure:  [facility] and given general [facility].  The abdomen was prepped with PCMX and draped sterilely.  A time out was performed.    The operation began by identifying the ligament of Treitz. I measured 40 cm downstream and divided the bowel with a 6 cm Covidian stapler.  I sutured a Penrose drain along the Roux limb end.  I measured a 1 meter (100 cm) Roux limb and then placed the distal bowels to the BP limb side by side and performed a stapled jejunojejunostomy. The common defect was closed from either end with 4-0 Vicryl using the Endo Stitch. The mesenteric defect was closed with a running 2-0 silk using the Endo Stitch. Tisseel was applied to the suture line.  The omentum was divided with the harmonic scalpel.  The Nathanson retractor was inserted in the left lateral segment of liver was retracted. The foregut dissection ensued.  A 5 cm pouch was measured along the lessor curvature.  The retrogastric space was entered and 2 firings of the purple Covidien were deployed.  The purple reinforced Covidien cartridges were then used to complete the pouch.    The Roux limb was then brought up with the candycane pointed left and a back row of sutures of 2-0 Vicryl were placed. I opened along the right side of each structure and inserted the 4.5 cm stapler to create the gastrojejunostomy. The common defect was closed from either end with 2-0 Vicryl and a second row was placed anterior to that the Ewald tube acting as a stent across the anastomosis. The Penrose drain was removed. Peterson's defect was closed with 2-0 silk.   Endoscopy was performed by Dr. Ezzard StandingNewman  which showed a good lumen, no evidence of leaks, and no bleeding.    The incisions were injected with Exprel and were closed with 4-0 Vicryl and Dermabond.   The patient was taken to the recovery room in satisfactory condition.  Matt B. Daphine DeutscherMartin, MD, FACS

## 2013-12-25 NOTE — Transfer of Care (Signed)
Immediate Anesthesia Transfer of Care Note  Patient: Renee Bates  Procedure(s) Performed: Procedure(s): LAPAROSCOPIC ROUX-EN-Y GASTRIC BYPASS WITH UPPER ENDOSCOPY (N/A)  Patient Location: PACU  Anesthesia Type:General  Level of Consciousness: awake, sedated and patient cooperative  Airway & Oxygen Therapy: Patient Spontanous Breathing and Patient connected to face mask oxygen  Post-op Assessment: Report given to PACU RN and Post -op Vital signs reviewed and stable  Post vital signs: Reviewed and stable  Complications: No apparent anesthesia complications

## 2013-12-25 NOTE — Interval H&P Note (Signed)
History and Physical Interval Note:  12/25/2013 7:20 AM  Renee Bates  has presented today for surgery, with the diagnosis of morbid obesity   The various methods of treatment have been discussed with the patient and family. After consideration of risks, benefits and other options for treatment, the patient has consented to  Procedure(s): LAPAROSCOPIC ROUX-EN-Y GASTRIC BYPASS WITH UPPER ENDOSCOPY (N/A) as a surgical intervention .  The patient's history has been reviewed, patient examined, no change in status, stable for surgery.  I have reviewed the patient's chart and labs.  Questions were answered to the patient's satisfaction.     Valarie MerinoMatthew B Arnetha Silverthorne

## 2013-12-25 NOTE — H&P (View-Only) (Signed)
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 She currently has a BMI of 42. She is a nonsmoker, non-aspirin taking young woman with hypertension and he does not have diabetes. She has watched a video send a lot of work and study and has decided she would like to have a gastric bypass. I discussed that with her in some detail and she is aware of the procedure and is eager to get started on a bariatric journey.  She denies any history of DVT problems in her legs. She has had 2 prior C-sections.   UGI showed no hiatus hernia and ultrasound showed no gallstones.  Past Medical History   Diagnosis  Date   .  Hypertension     Past Surgical History   Procedure  Laterality  Date   .  Cesarean section     .  Tubal ligation      Current Outpatient Prescriptions   Medication  Sig  Dispense  Refill   .  hydrochlorothiazide (HYDRODIURIL) 25 MG tablet  Take 25 mg by mouth daily.     .  megestrol (MEGACE) 40 MG tablet  Take 1 tablet (40 mg total) by mouth daily.  30 tablet  3   .  acetaminophen (TYLENOL) 500 MG tablet  Take 1,000 mg by mouth every 6 (six) hours as needed. For pain      No current facility-administered medications for this visit.   Review of patient's allergies indicates no known allergies.  No family history on file.  Social History: reports that she has never smoked. She does not have any smokeless tobacco history on file. She reports that she drinks alcohol. She reports that she does not use illicit drugs.  REVIEW OF SYSTEMS - PERTINENT POSITIVES ONLY:  Negative except for issues referable to her hypertension and obesity.  Physical Exam:  Blood pressure 118/82, pulse 72, resp. rate 14, height 5\' 2"  (1.575 m), weight 232 lb 6.4 oz (105.416 kg), last menstrual period 08/18/2013.  Body mass index is 42.5 kg/(m^2).  Gen: WDWN  African American female NAD  Neurological: Alert and oriented to person, place, and time. Motor and sensory function is grossly intact  Head: Normocephalic and atraumatic.  Eyes: Conjunctivae are normal. Pupils are equal, round, and reactive to light. No scleral icterus.  Neck: Normal range of motion. Neck supple. No tracheal deviation or thyromegaly present.  Cardiovascular: SR without murmurs or gallops. No carotid bruits  Respiratory: Effort normal. No respiratory distress. No chest wall tenderness. Breath sounds normal. No wheezes, rales or rhonchi.  Abdomen: Obese and nontender  GU:  Musculoskeletal: Normal range of motion. Extremities are nontender. No cyanosis, edema or clubbing noted Lymphadenopathy: No cervical, preauricular, postauricular or axillary adenopathy is present Skin: Skin is warm and dry. No rash noted. No diaphoresis. No erythema. No pallor. Pscyh: Normal mood and affect. Behavior is normal. Judgment and thought content normal.  LABORATORY RESULTS:  No results found for this or any previous visit (from the past 48 hour(s)).  RADIOLOGY RESULTS:  No results found.  Problem List:  Patient Active Problem List    Diagnosis  Date Noted   .  Hypertension  09/21/2013   .  Morbid obesity with body mass index of 40.0-44.9 in adult  09/21/2013   Assessment & Plan:  Morbid obesity BMI 42 for consideration  for laparoscopic Roux-en-Y gastric bypass.  Matt B. Daphine DeutscherMartin, MD, [facility]  [facility]  [phone] beeper  [phone]

## 2013-12-25 NOTE — Anesthesia Postprocedure Evaluation (Signed)
  Anesthesia Post-op Note  Patient: Renee Bates  Procedure(s) Performed: Procedure(s) (LRB): LAPAROSCOPIC ROUX-EN-Y GASTRIC BYPASS WITH UPPER ENDOSCOPY (N/A)  Patient Location: PACU  Anesthesia Type: General  Level of Consciousness: awake and alert   Airway and Oxygen Therapy: Patient Spontanous Breathing  Post-op Pain: mild  Post-op Assessment: Post-op Vital signs reviewed, Patient's Cardiovascular Status Stable, Respiratory Function Stable, Patent Airway and No signs of Nausea or vomiting  Last Vitals:  Filed Vitals:   12/25/13 1204  BP: 153/91  Pulse: 100  Temp: 36.5 C  Resp:     Post-op Vital Signs: stable   Complications: No apparent anesthesia complications

## 2013-12-25 NOTE — Anesthesia Preprocedure Evaluation (Addendum)
Anesthesia Evaluation  Patient identified by MRN, date of birth, ID band Patient awake    Reviewed: Allergy & Precautions, H&P , NPO status , Patient's Chart, lab work & pertinent test results  Airway Mallampati: II TM Distance: >3 FB Neck ROM: Full    Dental no notable dental hx.    Pulmonary sleep apnea ,  breath sounds clear to auscultation  Pulmonary exam normal       Cardiovascular hypertension, Pt. on medications Rhythm:Regular Rate:Normal     Neuro/Psych negative neurological ROS  negative psych ROS   GI/Hepatic negative GI ROS, Neg liver ROS,   Endo/Other  Morbid obesity  Renal/GU negative Renal ROS  negative genitourinary   Musculoskeletal negative musculoskeletal ROS (+)   Abdominal   Peds negative pediatric ROS (+)  Hematology negative hematology ROS (+)   Anesthesia Other Findings   Reproductive/Obstetrics negative OB ROS                          Anesthesia Physical Anesthesia Plan  ASA: III  Anesthesia Plan: General   Post-op Pain Management:    Induction: Intravenous  Airway Management Planned: Oral ETT  Additional Equipment:   Intra-op Plan:   Post-operative Plan:   Informed Consent: I have reviewed the patients History and Physical, chart, labs and discussed the procedure including the risks, benefits and alternatives for the proposed anesthesia with the patient or authorized representative who has indicated his/her understanding and acceptance.   Dental advisory given  Plan Discussed with: CRNA  Anesthesia Plan Comments:         Anesthesia Quick Evaluation

## 2013-12-26 ENCOUNTER — Inpatient Hospital Stay (HOSPITAL_COMMUNITY): Payer: BC Managed Care – PPO

## 2013-12-26 ENCOUNTER — Encounter (HOSPITAL_COMMUNITY): Payer: Self-pay | Admitting: Surgery

## 2013-12-26 LAB — CBC WITH DIFFERENTIAL/PLATELET
Basophils Absolute: 0 10*3/uL (ref 0.0–0.1)
Basophils Relative: 0 % (ref 0–1)
Eosinophils Absolute: 0 10*3/uL (ref 0.0–0.7)
Eosinophils Relative: 0 % (ref 0–5)
HCT: 33.2 % — ABNORMAL LOW (ref 36.0–46.0)
Hemoglobin: 11.1 g/dL — ABNORMAL LOW (ref 12.0–15.0)
Lymphocytes Relative: 19 % (ref 12–46)
Lymphs Abs: 2.1 10*3/uL (ref 0.7–4.0)
MCH: 29 pg (ref 26.0–34.0)
MCHC: 33.4 g/dL (ref 30.0–36.0)
MCV: 86.7 fL (ref 78.0–100.0)
Monocytes Absolute: 1 10*3/uL (ref 0.1–1.0)
Monocytes Relative: 9 % (ref 3–12)
Neutro Abs: 8.1 10*3/uL — ABNORMAL HIGH (ref 1.7–7.7)
Neutrophils Relative %: 72 % (ref 43–77)
Platelets: 339 10*3/uL (ref 150–400)
RBC: 3.83 MIL/uL — ABNORMAL LOW (ref 3.87–5.11)
RDW: 13.4 % (ref 11.5–15.5)
WBC: 11.2 10*3/uL — ABNORMAL HIGH (ref 4.0–10.5)

## 2013-12-26 LAB — HEMOGLOBIN AND HEMATOCRIT, BLOOD
HCT: 33.7 % — ABNORMAL LOW (ref 36.0–46.0)
Hemoglobin: 11.2 g/dL — ABNORMAL LOW (ref 12.0–15.0)

## 2013-12-26 MED ORDER — IOHEXOL 300 MG/ML  SOLN
50.0000 mL | Freq: Once | INTRAMUSCULAR | Status: AC | PRN
  Administered 2013-12-26: 40 mL via ORAL

## 2013-12-26 NOTE — Care Management Note (Signed)
    Page 1 of 1   12/26/2013     11:58:21 AM CARE MANAGEMENT NOTE 12/26/2013  Patient:  Renee Bates,Renee Bates   Account Number:  [id number]  Date Initiated:  12/26/2013  Documentation initiated by:  Lorenda IshiharaPEELE,Smt Lokey  Subjective/Objective Assessment:   33 yo female admitted s/p gastric bypass. PTA lived at home with family.     Action/Plan:   Home when stable   Anticipated DC Date:  12/28/2013   Anticipated DC Plan:  HOME/SELF CARE      DC Planning Services  CM consult      Choice offered to / List presented to:             Status of service:  Completed, signed off Medicare Important Message given?   (If response is "NO", the following Medicare IM given date fields will be blank) Date Medicare IM given:   Date Additional Medicare IM given:    Discharge Disposition:  HOME/SELF CARE  Per UR Regulation:  Reviewed for med. necessity/level of care/duration of stay  If discussed at Long Length of Stay Meetings, dates discussed:    Comments:

## 2013-12-27 LAB — CBC WITH DIFFERENTIAL/PLATELET
Basophils Absolute: 0 10*3/uL (ref 0.0–0.1)
Basophils Relative: 0 % (ref 0–1)
Eosinophils Absolute: 0 10*3/uL (ref 0.0–0.7)
Eosinophils Relative: 1 % (ref 0–5)
HCT: 32.8 % — ABNORMAL LOW (ref 36.0–46.0)
Hemoglobin: 10.5 g/dL — ABNORMAL LOW (ref 12.0–15.0)
Lymphocytes Relative: 33 % (ref 12–46)
Lymphs Abs: 2.6 10*3/uL (ref 0.7–4.0)
MCH: 28.2 pg (ref 26.0–34.0)
MCHC: 32 g/dL (ref 30.0–36.0)
MCV: 88.2 fL (ref 78.0–100.0)
Monocytes Absolute: 0.7 10*3/uL (ref 0.1–1.0)
Monocytes Relative: 9 % (ref 3–12)
Neutro Abs: 4.5 10*3/uL (ref 1.7–7.7)
Neutrophils Relative %: 57 % (ref 43–77)
Platelets: 373 10*3/uL (ref 150–400)
RBC: 3.72 MIL/uL — ABNORMAL LOW (ref 3.87–5.11)
RDW: 13.6 % (ref 11.5–15.5)
WBC: 7.9 10*3/uL (ref 4.0–10.5)

## 2013-12-27 NOTE — Progress Notes (Signed)
Patient ID: Renee Bates, female   DOB: [birth year], 33 y.o.   MRN: [id number] [facility] Progress Note:   2 Days Post-Op  Subjective: Mental status is clear.  She is complaining of a headache and doesn't want to go home yet.   Objective: Vital signs in last 24 hours: Temp:  [98.8 F (37.1 C)-99.4 F (37.4 C)] 98.8 F (37.1 C) (05/13 1000) Pulse Rate:  [69-80] 80 (05/13 1000) Resp:  [18] 18 (05/13 1000) BP: (120-187)/(79-94) 137/82 mmHg (05/13 1000) SpO2:  [96 %-98 %] 97 % (05/13 1000) Weight:  [237 lb 3.4 oz (107.6 kg)] 237 lb 3.4 oz (107.6 kg) (05/13 0019)  Intake/Output from previous day: 05/12 0701 - 05/13 0700 In: 2460 [P.O.:60; I.V.:2400] Out: 2250 [Urine:2250] Intake/Output this shift: Total I/O In: -  Out: 200 [Urine:200]  Physical Exam: Work of breathing is normal.  Abdomen is sore.    Lab Results:  Results for orders placed during the hospital encounter of 12/25/13 (from the past 48 hour(s))  CBC     Status: Abnormal   Collection Time    12/25/13  2:06 PM      Result Value Ref Range   WBC 14.8 (*) 4.0 - 10.5 K/uL   RBC 3.94  3.87 - 5.11 MIL/uL   Hemoglobin 11.6 (*) 12.0 - 15.0 g/dL   HCT 34.0 (*) 36.0 - 46.0 %   MCV 86.3  78.0 - 100.0 fL   MCH 29.4  26.0 - 34.0 pg   MCHC 34.1  30.0 - 36.0 g/dL   RDW 13.3  11.5 - 15.5 %   Platelets 430 (*) 150 - 400 K/uL  CREATININE, SERUM     Status: None   Collection Time    12/25/13  2:06 PM      Result Value Ref Range   Creatinine, Ser 0.73  0.50 - 1.10 mg/dL   GFR calc non Af Amer >90  >90 mL/min   GFR calc Af Amer >90  >90 mL/min   Comment: (NOTE)     The eGFR has been calculated using the CKD EPI equation.     This calculation has not been validated in all clinical situations.     eGFR's persistently <90 mL/min signify possible Chronic Kidney     Disease.  CBC WITH DIFFERENTIAL     Status: Abnormal   Collection Time    12/26/13  4:03 AM      Result Value Ref Range   WBC 11.2 (*) 4.0 - 10.5 K/uL   RBC 3.83 (*) 3.87 - 5.11 MIL/uL   Hemoglobin 11.1 (*) 12.0 - 15.0 g/dL   HCT 33.2 (*) 36.0 - 46.0 %   MCV 86.7  78.0 - 100.0 fL   MCH 29.0  26.0 - 34.0 pg   MCHC 33.4  30.0 - 36.0 g/dL   RDW 13.4  11.5 - 15.5 %   Platelets 339  150 - 400 K/uL   Neutrophils Relative % 72  43 - 77 %   Neutro Abs 8.1 (*) 1.7 - 7.7 K/uL   Lymphocytes Relative 19  12 - 46 %   Lymphs Abs 2.1  0.7 - 4.0 K/uL   Monocytes Relative 9  3 - 12 %   Monocytes Absolute 1.0  0.1 - 1.0 K/uL   Eosinophils Relative 0  0 - 5 %   Eosinophils Absolute 0.0  0.0 - 0.7 K/uL   Basophils Relative 0  0 - 1 %   Basophils Absolute 0.0  0.0 - 0.1 K/uL  HEMOGLOBIN AND HEMATOCRIT, BLOOD     Status: Abnormal   Collection Time    12/26/13  4:31 PM      Result Value Ref Range   Hemoglobin 11.2 (*) 12.0 - 15.0 g/dL   HCT 33.7 (*) 36.0 - 46.0 %  CBC WITH DIFFERENTIAL     Status: Abnormal   Collection Time    12/27/13  5:06 AM      Result Value Ref Range   WBC 7.9  4.0 - 10.5 K/uL   RBC 3.72 (*) 3.87 - 5.11 MIL/uL   Hemoglobin 10.5 (*) 12.0 - 15.0 g/dL   HCT 32.8 (*) 36.0 - 46.0 %   MCV 88.2  78.0 - 100.0 fL   MCH 28.2  26.0 - 34.0 pg   MCHC 32.0  30.0 - 36.0 g/dL   RDW 13.6  11.5 - 15.5 %   Platelets 373  150 - 400 K/uL   Neutrophils Relative % 57  43 - 77 %   Neutro Abs 4.5  1.7 - 7.7 K/uL   Lymphocytes Relative 33  12 - 46 %   Lymphs Abs 2.6  0.7 - 4.0 K/uL   Monocytes Relative 9  3 - 12 %   Monocytes Absolute 0.7  0.1 - 1.0 K/uL   Eosinophils Relative 1  0 - 5 %   Eosinophils Absolute 0.0  0.0 - 0.7 K/uL   Basophils Relative 0  0 - 1 %   Basophils Absolute 0.0  0.0 - 0.1 K/uL    Radiology/Results: Dg Ugi W/water Sol Cm  12/26/2013   CLINICAL DATA:  Status post gastric bypass.  EXAM: WATER SOLUBLE UPPER GI SERIES  TECHNIQUE: Single-column upper GI series was performed using water soluble contrast.  CONTRAST:  40 mL OMNIPAQUE IOHEXOL 300 MG/ML  SOLN  COMPARISON:  Upper GI series 0 09/28/2013.  FLUOROSCOPY TIME:  59  seconds.  FINDINGS: The patient is status post gastric bypass. The gastric pouch appears normal. Contrast flows into distal small bowel loops. No leakage of contrast is identified. The patient tolerated the procedure well.  IMPRESSION: Status post gastric bypass.  No acute finding.   Electronically Signed   By: Inge Rise M.D.   On: 12/26/2013 09:15    Anti-infectives: Anti-infectives   Start     Dose/Rate Route Frequency Ordered Stop   12/25/13 0554  cefOXitin (MEFOXIN) 2 g in dextrose 5 % 50 mL IVPB     2 g 100 mL/hr over 30 Minutes Intravenous On call to O.R. 12/25/13 0554 12/25/13 0732      Assessment/Plan: Problem List: Patient Active Problem List   Diagnosis Date Noted  . 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 S/P gastric bypass 12/25/2013  . Other disorder of menstruation and other abnormal bleeding from female genital tract 12/07/2013  . OSA (obstructive sleep apnea) 11/06/2013  . Hypertension 09/21/2013  . Morbid obesity with body mass index of 40.0-44.9 in adult 09/21/2013    Taking protein shakes.  Has headache and not ready to go home.  2 Days Post-Op    LOS: 2 days   Matt B. Hassell Done, MD, [facility] [phone] beeper [phone]  12/27/2013 12:54 PM

## 2013-12-27 NOTE — Progress Notes (Signed)
Nutrition Education Note  Received consult for diet education per DROP protocol.   Discussed 2 week post op diet with pt. Emphasized that liquids must be non carbonated, non caffeinated, and sugar free. Fluid goals discussed. Pt to follow up with outpatient bariatric RD for further diet progression after 2 weeks. Multivitamins and minerals also reviewed. Teach back method used, pt expressed understanding, expect good compliance.   Diet: First 2 Weeks  You will see the nutritionist about two (2) weeks after your surgery. The nutritionist will increase the types of foods you can eat if you are handling liquids well:  If you have severe vomiting or nausea and cannot handle clear liquids lasting longer than 1 day, call your surgeon  Protein Shake  Drink at least 2 ounces of shake 5-6 times per day  Each serving of protein shakes (usually 8 - 12 ounces) should have a minimum of:  15 grams of protein  And no more than 5 grams of carbohydrate  Goal for protein each day:  Men = 80 grams per day  Women = 60 grams per day  Protein powder may be added to fluids such as non-fat milk or Lactaid milk or Soy milk (limit to 35 grams added protein powder per serving)   Hydration  Slowly increase the amount of water and other clear liquids as tolerated (See Acceptable Fluids)  Slowly increase the amount of protein shake as tolerated  Sip fluids slowly and throughout the day  May use sugar substitutes in small amounts (no more than 6 - 8 packets per day; i.e. Splenda)   Fluid Goal  The first goal is to drink at least 8 ounces of protein shake/drink per day (or as directed by the nutritionist); some examples of protein shakes are I[employer]Syntrax Nectar, Dillard'sdkins Advantage, EAS Edge HP, and Unjury. See handout from pre-op Bariatric Education Class:  Slowly increase the amount of protein shake you drink as tolerated  You may find it easier to slowly sip shakes throughout the day  It is important to get your proteins  in first  Your fluid goal is to drink 64 - 100 ounces of fluid daily  It may take a few weeks to build up to this  32 oz (or more) should be clear liquids  And  32 oz (or more) should be full liquids (see below for examples)  Liquids should not contain sugar, caffeine, or carbonation   Clear Liquids:  Water or Sugar-free flavored water (i.e. Fruit H2O, Propel)  Decaffeinated coffee or tea (sugar-free)  Crystal Lite, Wyler's Lite, Minute Maid Lite  Sugar-free Jell-O  Bouillon or broth  Sugar-free Popsicle: *Less than 20 calories each; Limit 1 per day   Full Liquids:  Protein Shakes/Drinks + 2 choices per day of other full liquids  Full liquids must be:  No More Than 12 grams of Carbs per serving  No More Than 3 grams of Fat per serving  Strained low-fat cream soup  Non-Fat milk  Fat-free Lactaid Milk  Sugar-free yogurt (Dannon Lite & Fit, Greek yogurt)     Laguna HeightsHeather Baron MS, RD, LDN [phone] Pager [phone] After Hours Pager

## 2013-12-27 NOTE — Progress Notes (Signed)
Patient alert and oriented, pain is controlled. Patient is tolerating fluids, plan to advance to protein shake today. Reviewed Gastric Bypass discharge instructions with patient and patient is able to articulate understanding. Provided information on BELT program, Support Group and [facility] outpatient pharmacy. All questions answered, will continue to monitor.  

## 2013-12-28 NOTE — Discharge Summary (Signed)
Physician Discharge Summary  Patient ID: Renee Bates MRN: [id number] DOB/AGE: 33/01/1981 32 y.o.  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  Observed an extra day because of headache and pO intake.  Feeling well and ready for discharge.  Consults: none  Significant Diagnostic Studies: UGI    Discharge Exam: Blood pressure 102/67, pulse 67, temperature 98.2 F (36.8 C), temperature source Oral, resp. rate 18, height 5\' 2"  (1.575 m), weight 238 lb 8.6 oz (108.2 kg), SpO2 98.00%. Incisions covered with dermabond  Disposition: 01-Home or Self Care  Discharge Orders   Future Appointments Provider Department Dept Phone   01/09/2014 3:30 PM [facility] Post-Op Class [facility] [phone]   01/10/2014 4:30 PM Valarie MerinoMatthew B Brittin Belnap, MD [facility] [place] [phone]   Future Orders Complete By Expires   Discharge instructions  As directed        Medication List         hydrochlorothiazide 25 MG tablet  Commonly known as:  HYDRODIURIL  Take 25 mg by mouth every morning.     HYDROcodone-acetaminophen 7.5-325 mg/15 ml solution  Commonly known as:  HYCET  Take 15 mLs by mouth every 4 (four) hours as needed for moderate pain.     megestrol 40 MG tablet  Commonly known as:  MEGACE  Take 1 tablet (40 mg total) by mouth daily.     NASCOBAL 500 MCG/0.1ML Soln  Generic drug:  Cyanocobalamin           Follow-up Information   Follow up with Valarie MerinoMARTIN,Wynell Halberg B, MD.   Specialty:  General Surgery   Contact information:   [address] [place] [address] [phone]       Signed: Valarie MerinoMatthew B Trendon Zaring 12/28/2013, 12:38 PM

## 2013-12-28 NOTE — Progress Notes (Signed)
Patient alert and oriented, Post op day 3.  Provided support and encouragement.  Encouraged pulmonary toilet, ambulation and small sips of liquids. All questions answered.  Will continue to monitor. 

## 2013-12-28 NOTE — Discharge Instructions (Signed)

## 2013-12-29 ENCOUNTER — Telehealth (HOSPITAL_COMMUNITY): Payer: Self-pay

## 2013-12-29 NOTE — Telephone Encounter (Signed)
Made discharge phone call to patient per DROP protocol. Asking the following questions.    1. Do you have someone to care for you now that you are home?  yes 2. Are you having pain now that is not relieved by your pain medication?  no 3. Are you able to drink the recommended daily amount of fluids (48 ounces minimum/day) and protein (60-80 grams/day) as prescribed by the dietitian or nutritional counselor?  Working on it 4. Are you taking the vitamins and minerals as prescribed?  yes 5. Do you have the "on call" number to contact your surgeon if you have a problem or question?  yes 6. Are your incisions free of redness, swelling or drainage? (If steri strips, address that these can fall off, shower as tolerated) yes 7. Have your bowels moved since your surgery?  If not, are you passing gas?  yes 8. Are you up and walking 3-4 times per day?  yes 9. Do you have an appointment to see a dietitian or nutritional counselor in the next month?  yes  The following questions can be added at the center's discharge phone call script at the center's discretion.  The questions are also captured within [employer] project custom fields. 1. Do you have an appointment made to see your surgeon in the next month?  yes 2. Were you provided your discharge medications before your surgery or before you were discharged from the hospital and are you taking them without problem?  yes 3. Were you provided phone numbers to the clinic/surgeon's office?  yes 4. Did you watch the patient education video module in the (clinic, surgeon's office, etc.) before your surgery? yes 5. Do you have a discharge checklist that was provided to you in the hospital to reference with instructions on how to take care of yourself after surgery?  yes 6. Did you see a dietitian or nutritional counselor while you were in the hospital?  yes

## 2014-01-09 ENCOUNTER — Encounter: Payer: BC Managed Care – PPO | Attending: Obstetrics and Gynecology

## 2014-01-09 VITALS — Ht 62.0 in | Wt 223.5 lb

## 2014-01-09 DIAGNOSIS — Z6841 Body Mass Index (BMI) 40.0 and over, adult: Secondary | ICD-10-CM

## 2014-01-09 DIAGNOSIS — Z713 Dietary counseling and surveillance: Secondary | ICD-10-CM | POA: Insufficient documentation

## 2014-01-09 NOTE — Progress Notes (Signed)
Bariatric Class:  Appt start time: 1530 end time:  1630.  2 Week Post-Operative Nutrition Class  Patient was seen on 01/09/2014 for Post-[facility]ative [facility].   Surgery date: 12/25/2013 Surgery type: RYGB Start weight at [facility]: 241 lbs on 10/24/2013 Weight today: 223.5 lbs Weight change: 20.5 lbs  TANITA  BODY COMP RESULTS  12/11/13 01/09/14   BMI (kg/m^2) 44.6 40.9   Fat Mass (lbs) 120.5 112.0   Fat Free Mass (lbs) 123.5 111.5   Total Body Water (lbs) 90.5 81.5    The following the learning objectives were met by the patient during this course:  Identifies Phase 3A (Soft, High Proteins) Dietary Goals and will begin from 2 weeks post-operatively to 2 months post-operatively  Identifies appropriate sources of fluids and proteins   States protein recommendations and appropriate sources post-operatively  Identifies the need for appropriate texture modifications, mastication, and bite sizes when consuming solids  Identifies appropriate multivitamin and calcium sources post-operatively  Describes the need for physical activity post-operatively and will follow MD recommendations  States when to call healthcare provider regarding medication questions or post-operative complications  Handouts given during class include:  Phase 3A: Soft, High Protein Diet Handout  Follow-Up Plan: Patient will follow-up at [facility] in 6 weeks for 2 month post-op nutrition visit for diet advancement per MD.

## 2014-01-10 ENCOUNTER — Ambulatory Visit (INDEPENDENT_AMBULATORY_CARE_PROVIDER_SITE_OTHER): Payer: BC Managed Care – PPO | Admitting: Surgery

## 2014-01-10 ENCOUNTER — Encounter (INDEPENDENT_AMBULATORY_CARE_PROVIDER_SITE_OTHER): Payer: Self-pay | Admitting: Surgery

## 2014-01-10 VITALS — BP 120/70 | HR 81 | Temp 97.2°F | Ht 62.0 in | Wt 223.2 lb

## 2014-01-10 DIAGNOSIS — Z9884 Bariatric surgery status: Secondary | ICD-10-CM

## 2014-01-10 NOTE — Progress Notes (Signed)
Areli Weir 33 y.o.  Body mass index is 40.81 kg/(m^2).  Patient Active Problem List   Diagnosis Date Noted  . 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 S/P gastric bypass 12/25/2013  . Other disorder of menstruation and other abnormal bleeding from female genital tract 12/07/2013  . OSA (obstructive sleep apnea) 11/06/2013  . Hypertension 09/21/2013  . Morbid obesity with body mass index of 40.0-44.9 in adult 09/21/2013    No Known Allergies  Past Surgical History  Procedure Laterality Date  . Cesarean section  2003 (twins), 2007    x2  . Tubal ligation    . Breath tek h pylori N/A 10/13/2013    Procedure: BREATH TEK H PYLORI;  Surgeon: Valarie Merino, MD;  Location: [facility];  Service: General;  Laterality: N/A;  . Gastric roux-en-y N/A 12/25/2013    Procedure: LAPAROSCOPIC ROUX-EN-Y GASTRIC BYPASS WITH UPPER ENDOSCOPY;  Surgeon: Valarie Merino, MD;  Location: [facility];  Service: General;  Laterality: N/A;   Tilda Burrow, MD No diagnosis found.  Doing well.  No issues.  Incisions OK.  Will see back in 6 weeks.  Weight down 10 lbs in 2 weeks.   Matt B. Daphine Deutscher, MD, [facility] [phone] beeper [phone]  01/10/2014 4:55 PM

## 2014-01-10 NOTE — Patient Instructions (Signed)
Gastric Bypass Surgery Care After Refer to this sheet in the next few weeks. These discharge instructions provide you with general information on caring for yourself after you leave the hospital. Your caregiver may also give you specific instructions. Your treatment has been planned according to the most current medical practices available, but unavoidable complications sometimes occur. If you have any problems or questions after discharge, call your caregiver. HOME CARE INSTRUCTIONS  Activity  Take frequent walks throughout the day. This will help to prevent blood clots. Do not sit for longer than 45 minutes to 1 hour while awake for 4 to 6 weeks after surgery.  Continue to do coughing and deep breathing exercises once you get home. This will help to prevent pneumonia.  Do not do strenuous activities, such as heavy lifting, pushing, or pulling, until after your follow-up visit with your caregiver. Do not lift anything heavier than 10 lb (4.5 kg).  Talk with your caregiver about when you may return to work and your exercise routine.  Do not drive while taking prescription pain medicine. Nutrition  It is very important that you drink at least 80 oz (2,400 mL) of fluid a day.  You should stay on a liquid diet until your follow-up visit with your caregiver. Keep sugar-free, liquid items on hand, including:  Tea: hot or cold. Drink only decaffeinated for the first month.  Broths: beef, chicken, vegetable.  Others: water, sugar-free frozen ice pops, flavored water, gelatin (after 1 week).  Do not consume caffeine for 1 month. Large amounts of caffeine can cause dehydration.  A dietician may also give you specific instructions.  Follow your caregiver's recommendations about vitamins and protein requirements after surgery. Hygiene  You may shower and wash your hair 2 days after surgery. Pat incisions dry. Do not rub incisions with a washcloth or towel.  Follow your caregiver's  recommendations about baths and pools following surgery. Pain control  If a prescription medicine was given, follow your caregiver's directions.  You may feel some gas pain caused by the carbon dioxide used to inflate your abdomen during surgery. This pain can be felt in your chest, shoulder, back, or abdominal area. Moving around often is advised. Incision care  You may have 4 or more small incisions. They are closed with skin adhesive strips. Skin adhesive strips can get wet and will fall off on their own. Check your incisions and surrounding area daily for any redness, swelling, discoloration, fluid (drainage), or bleeding. Dark red, dried blood may appear under these coverings. This is normal.  If you have a drain, it will be removed at your follow-up visit or before you leave the hospital.  If your drain is left in, follow your caregiver's instructions on drain care.  If your drain is taken out, keep a clean, dry bandage over the drain site. SEEK MEDICAL CARE IF:   You develop persistent nausea and vomiting.  You have pain and discomfort with swallowing.  You have pain, swelling, or warmth in the lower extremities.  You have an oral temperature above 102 F (38.9 C).  You develop chills.  Your incision sites look red, swollen, or have drainage.  Your stool is black, tarry, or maroon in color.  You are lightheaded when standing.  You notice a bruise getting larger.  You have any questions or concerns. SEEK IMMEDIATE MEDICAL CARE IF:   You have chest pain.  You have severe calf pain or pain not relieved by medicine.  You develop shortness of   breath or difficulty breathing.  There is bright red blood coming from the drain.  You feel confused.  You have slurred speech.  You suddenly feel weak. MAKE SURE YOU:   Understand these instructions.  Will watch your condition.  Will get help right away if you are not doing well or get worse. Document Released:  03/17/2004 Document Revised: 11/28/2012 Document Reviewed: 12/24/2009 ExitCare Patient Information 2014 [employer].  

## 2014-02-22 ENCOUNTER — Encounter: Payer: BC Managed Care – PPO | Attending: Obstetrics and Gynecology | Admitting: Dietician

## 2014-02-22 VITALS — Ht 62.0 in | Wt 205.0 lb

## 2014-02-22 DIAGNOSIS — Z6841 Body Mass Index (BMI) 40.0 and over, adult: Secondary | ICD-10-CM

## 2014-02-22 DIAGNOSIS — Z713 Dietary counseling and surveillance: Secondary | ICD-10-CM | POA: Insufficient documentation

## 2014-02-22 NOTE — Patient Instructions (Addendum)
Goals:  Follow Phase 3B: High Protein + Non-Starchy Vegetables  Eat 3-6 small meals/snacks, every 3-5 hrs  Increase lean protein foods to meet 60g goal  Yogurt, cheese, deli meat, tuna, eggs, premier protein shakes  [email or web address] (fill out registration form)  Increase fluid intake to 64oz +  Aim for >30 min of physical activity daily   TANITA  BODY COMP RESULTS  12/11/13 01/09/14 02/22/14   BMI (kg/m^2) 44.6 40.9 37.5   Fat Mass (lbs) 120.5 112.0 91.5   Fat Free Mass (lbs) 123.5 111.5 113.5   Total Body Water (lbs) 90.5 81.5 83

## 2014-02-22 NOTE — Progress Notes (Signed)
  Follow-up visit:  8 Weeks Post-Operative RYGB Surgery  Medical Nutrition Therapy:  Appt start time: 1150 end time:  1220.  Primary concerns today: Post-operative Bariatric Surgery Nutrition Management. Tyler AasDoris returns today with an 18.5 pound weight loss since last visit. She reports that she can't tolerate meat and often does not feel hungry. She is currently not meeting her protein or fluid needs.  Surgery date: 12/25/2013 Surgery type: RYGB [facility]: 241 lbs on 10/24/2013 Weight today: 205 lbs Weight change: 18.5 lbs Total weight lost: 36 lbs  TANITA  BODY COMP RESULTS  12/11/13 01/09/14 02/22/14   BMI (kg/m^2) 44.6 40.9 37.5   Fat Mass (lbs) 120.5 112.0 91.5   Fat Free Mass (lbs) 123.5 111.5 113.5   Total Body Water (lbs) 90.5 81.5 83    Preferred Learning Style:  No preference indicated   Learning Readiness:   Ready  24-hr recall: B (AM): broccoli and 1 oz cheese and [place] yogurt (18 g) Snk ( AM):   L (PM): 1/2 can tuna (12g) Snk (PM):   D (PM): Premiere shake (30g) Snk ( PM):   Fluid intake: 48 oz water with sugar free flavoring Estimated total protein intake: 40-55 g per day  Medications: see list Supplementation: taking  Using straws: no Drinking while eating: no Hair loss: no Carbonated beverages: no N/V/D/C: vomiting with meat Dumping syndrome: once with ground beef and cheese  Recent physical activity:  none  Progress Towards Goal(s):  In progress.  Handouts given during visit include:  Phase 3A lean protein foods  Phase 3B lean protein + non-starchy vegetables   Nutritional Diagnosis:  [place]-3.3 Overweight/obesity related to past poor dietary habits and physical inactivity as evidenced by patient w/ recent RYGB surgery following dietary guidelines for continued weight loss.     Intervention:  Nutrition counseling provided.  Samples provided and patient instructed on proper use: Premier protein shake (strawberry - qty 1) Lot#:  [id number] Exp: 07/2014   Teaching Method Utilized: Visual Auditory Hands on  Barriers to learning/adherence to lifestyle change: none  Demonstrated degree of understanding via:  Teach Back   Monitoring/Evaluation:  Dietary intake, exercise, and body weight. Follow up in 1 months for 3 month post-op visit.

## 2014-02-23 ENCOUNTER — Ambulatory Visit (INDEPENDENT_AMBULATORY_CARE_PROVIDER_SITE_OTHER): Payer: BC Managed Care – PPO | Admitting: Surgery

## 2014-03-29 ENCOUNTER — Ambulatory Visit: Payer: BC Managed Care – PPO | Admitting: Dietician

## 2014-03-30 ENCOUNTER — Encounter (INDEPENDENT_AMBULATORY_CARE_PROVIDER_SITE_OTHER): Payer: BC Managed Care – PPO | Admitting: Adult Health

## 2014-03-30 ENCOUNTER — Encounter: Payer: Self-pay | Admitting: Adult Health

## 2014-03-30 ENCOUNTER — Ambulatory Visit (INDEPENDENT_AMBULATORY_CARE_PROVIDER_SITE_OTHER): Payer: BC Managed Care – PPO | Admitting: Adult Health

## 2014-03-30 VITALS — BP 136/94 | Ht 62.0 in | Wt 194.8 lb

## 2014-03-30 DIAGNOSIS — Z0000 Encounter for general adult medical examination without abnormal findings: Secondary | ICD-10-CM

## 2014-03-30 HISTORY — DX: Encounter for general adult medical examination without abnormal findings: Z00.00

## 2014-03-30 NOTE — Patient Instructions (Signed)
Get TB test at health dept. Follow up prn

## 2014-03-30 NOTE — Progress Notes (Signed)
Subjective:     Patient ID: Konrad Pentaoris Harrison, female   DOB: 05/10/1981, 33 y.o.   MRN: [id number]  HPI Tyler AasDoris is a 33 year old black female in to get physical to become foster care parent.No complaints, she had gastric by pass 12/25/13 and has lost 50 lbs.  Review of Systems See HPI Reviewed past medical,surgical, social and family history. Reviewed medications and allergies.     Objective:   Physical Exam BP 136/94  Ht 5\' 2"  (1.575 m)  Wt 194 lb 12 oz (88.338 kg)  BMI 35.61 kg/m2   Skin warm and dry. Neck: mid line trachea, normal thyroid. Lungs: clear to ausculation bilaterally. Cardiovascular: regular rate and rhythm, has soft murmur that is not always heard.  Assessment:     Physical to become foster parent    Plan:     Get TB test at health dept. Follow up prn

## 2014-04-02 NOTE — Progress Notes (Signed)
This encounter was created in error - please disregard.

## 2014-04-06 ENCOUNTER — Ambulatory Visit (INDEPENDENT_AMBULATORY_CARE_PROVIDER_SITE_OTHER): Payer: BC Managed Care – PPO | Admitting: Surgery

## 2014-04-27 ENCOUNTER — Ambulatory Visit (INDEPENDENT_AMBULATORY_CARE_PROVIDER_SITE_OTHER): Payer: BC Managed Care – PPO | Admitting: Surgery

## 2014-06-18 ENCOUNTER — Encounter: Payer: Self-pay | Admitting: Adult Health

## 2014-07-16 ENCOUNTER — Telehealth: Payer: Self-pay | Admitting: Adult Health

## 2014-07-16 MED ORDER — MEGESTROL ACETATE 40 MG PO TABS: 40.0000 mg | ORAL_TABLET | Freq: Every day | ORAL | Status: DC

## 2014-07-16 NOTE — Telephone Encounter (Signed)
Refilled megace keep appt

## 2014-07-16 NOTE — Telephone Encounter (Signed)
Spoke with pt. Pt is requesting a refill on Megace. She is requesting several refills so she don't have to keep calling us every month. Pt has an appt 12/2. Thanks!! JSY

## 2014-07-18 ENCOUNTER — Encounter: Payer: Self-pay | Admitting: Adult Health

## 2014-07-18 ENCOUNTER — Ambulatory Visit (INDEPENDENT_AMBULATORY_CARE_PROVIDER_SITE_OTHER): Payer: BC Managed Care – PPO | Admitting: Adult Health

## 2014-07-18 ENCOUNTER — Other Ambulatory Visit (HOSPITAL_COMMUNITY)
Admission: RE | Admit: 2014-07-18 | Discharge: 2014-07-18 | Disposition: A | Discharge status: Home / Self Care | Payer: BC Managed Care – PPO | Source: Ambulatory Visit | Attending: Adult Health | Admitting: Adult Health

## 2014-07-18 VITALS — BP 110/60 | Ht 62.0 in | Wt 174.0 lb

## 2014-07-18 DIAGNOSIS — N920 Excessive and frequent menstruation with regular cycle: Secondary | ICD-10-CM

## 2014-07-18 DIAGNOSIS — Z01419 Encounter for gynecological examination (general) (routine) without abnormal findings: Secondary | ICD-10-CM | POA: Insufficient documentation

## 2014-07-18 DIAGNOSIS — Z1151 Encounter for screening for human papillomavirus (HPV): Secondary | ICD-10-CM | POA: Insufficient documentation

## 2014-07-18 DIAGNOSIS — Z124 Encounter for screening for malignant neoplasm of cervix: Secondary | ICD-10-CM

## 2014-07-18 DIAGNOSIS — Z113 Encounter for screening for infections with a predominantly sexual mode of transmission: Secondary | ICD-10-CM | POA: Insufficient documentation

## 2014-07-18 NOTE — Progress Notes (Signed)
Subjective:     Patient ID: Renee Bates, female   DOB: 10/07/1980, 33 y.o.   MRN: [id number]  HPI Renee Bates is a 33 year old black female in for just a pap and follow up on megace.  Review of Systems See HPI Reviewed past medical,surgical, social and family history. Reviewed medications and allergies.     Objective:   Physical Exam BP 110/60 mmHg  Ht 5\' 2"  (1.575 m)  Wt 174 lb (78.926 kg)  BMI 31.82 kg/m2   Skin warm and dry.Pelvic: external genitalia is normal in appearance, vagina: has good color, moisture and rugae., cervix:smooth and bulbous, pap performed with HPV and GC/CHL, uterus: normal size, shape and contour, non tender, no masses felt, adnexa: no masses or tenderness noted.  Megace stops her periods and she is happy. She has lost about 60 lbs since gastric by pass.  Assessment:     Pap smear Excessive menstruation     Plan:     Continue megace as needed Physical in 1 year

## 2014-07-18 NOTE — Patient Instructions (Signed)
Physical in 1 year f

## 2014-07-20 LAB — CYTOLOGY - PAP

## 2014-08-02 ENCOUNTER — Other Ambulatory Visit: Payer: Self-pay | Admitting: Obstetrics & Gynecology

## 2015-02-06 ENCOUNTER — Other Ambulatory Visit: Payer: Self-pay | Admitting: Obstetrics & Gynecology

## 2016-01-10 IMAGING — CR DG CHEST 2V
2 series · 2 of 2 positions shown · non-contrast
Comparison: November 24, 2009

CLINICAL DATA: Preoperative bariatric surgery for obesity

EXAM:
CHEST  2 VIEW

[view not recorded (1 of 2)]
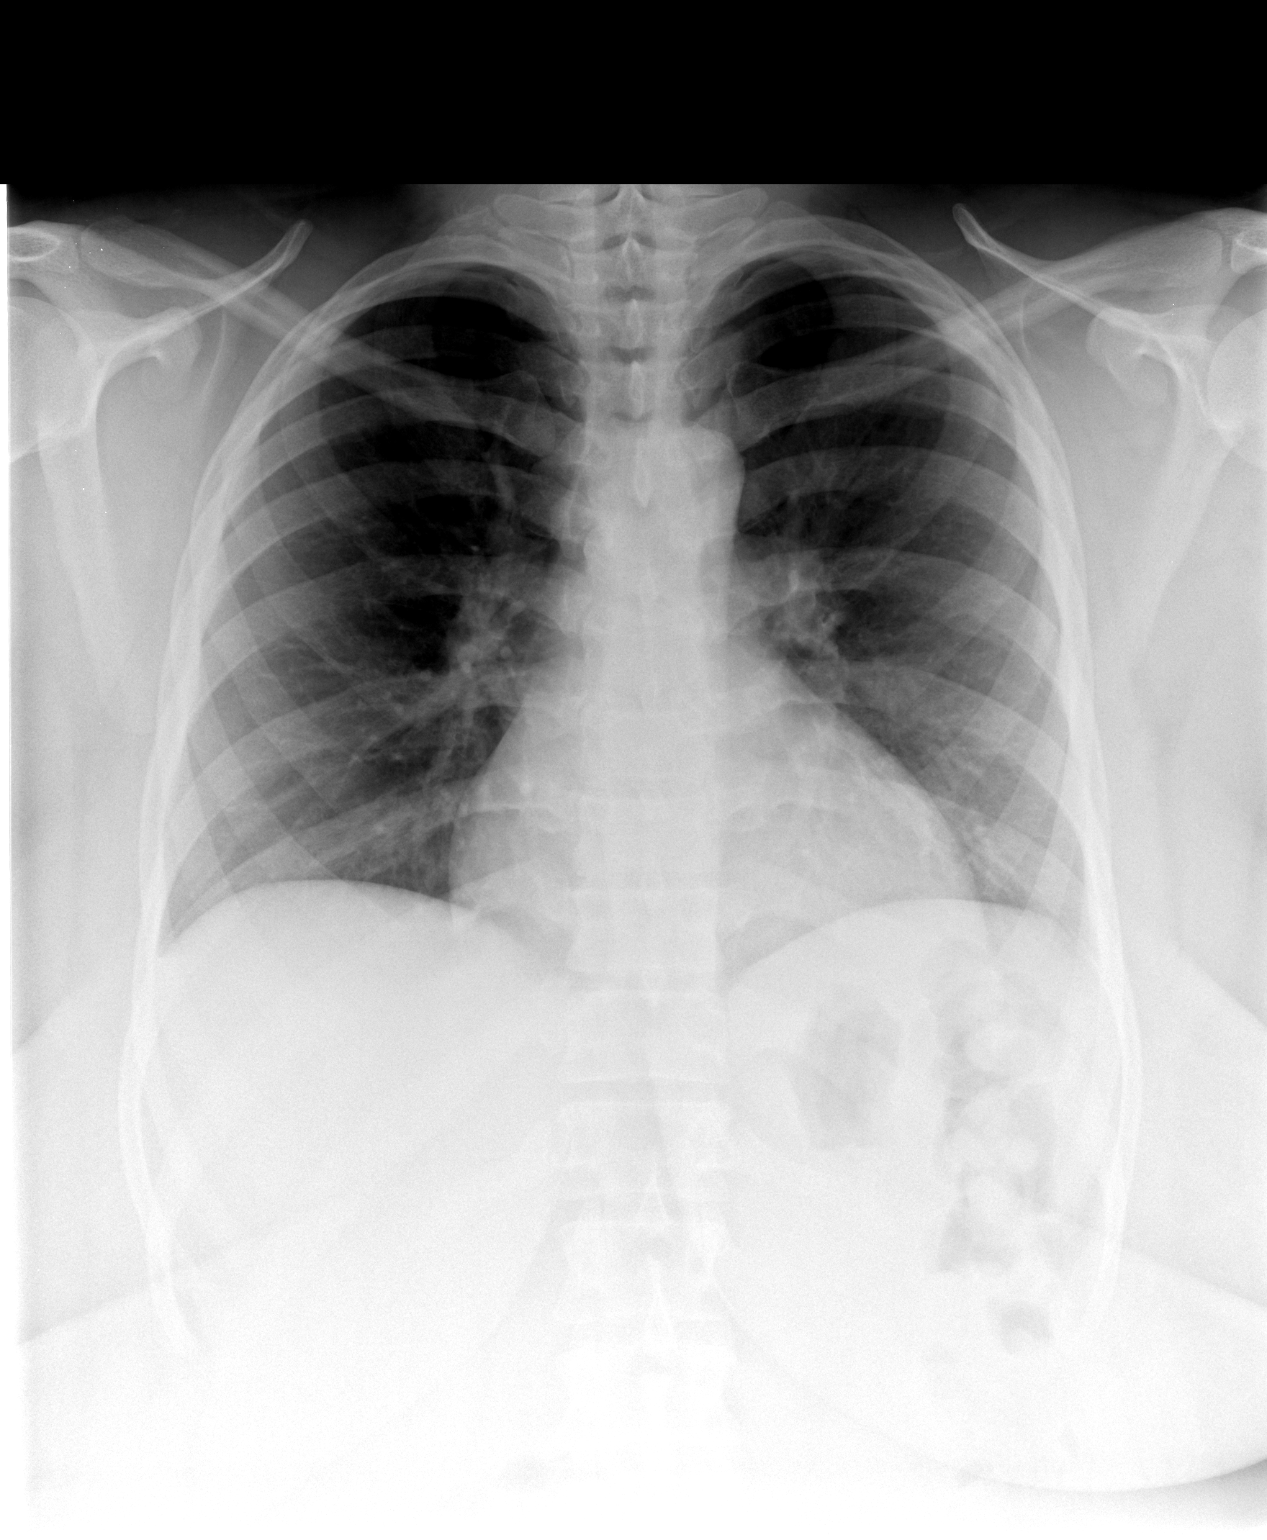

[view not recorded (2 of 2)]
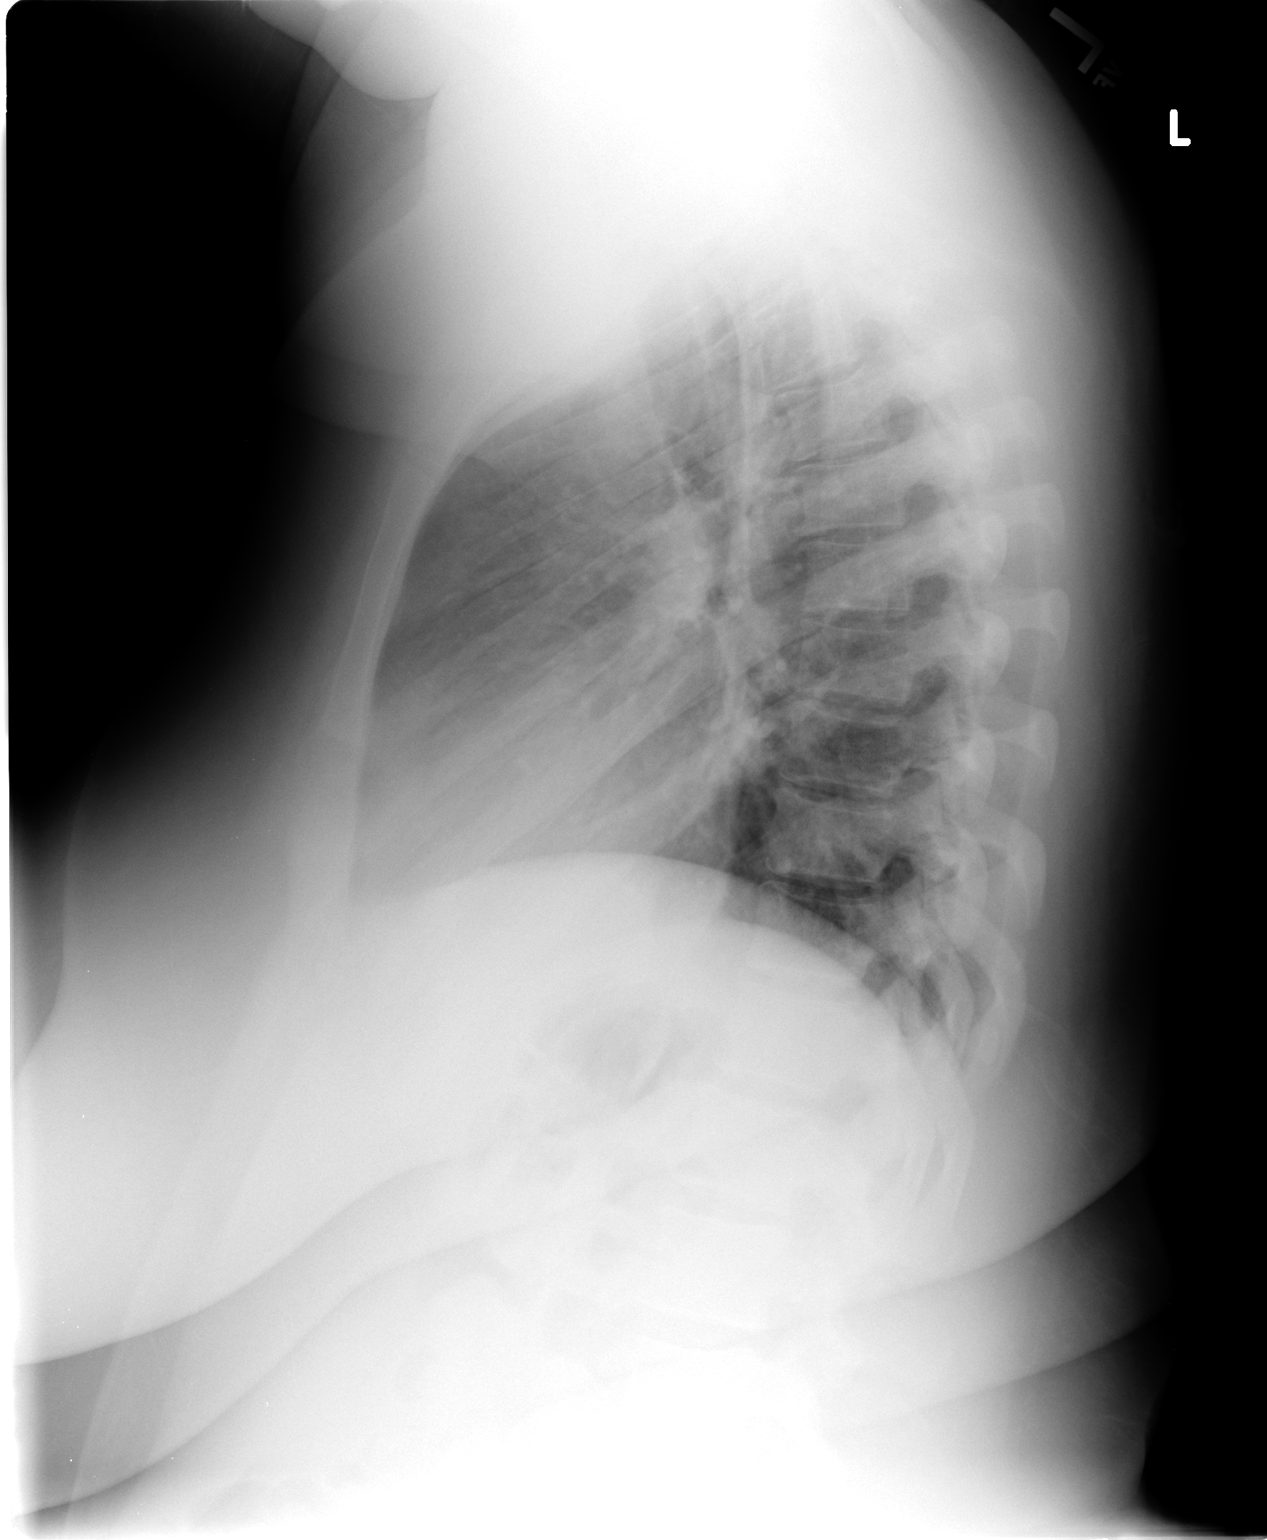

[2 of 2 positions shown; findings below may reference images not displayed]

FINDINGS: Lungs are clear. Heart size and pulmonary vascularity are normal. No
adenopathy. No bone lesions.
IMPRESSION: No abnormality noted.

## 2016-02-18 ENCOUNTER — Other Ambulatory Visit: Payer: Self-pay | Admitting: Obstetrics & Gynecology

## 2016-05-22 ENCOUNTER — Encounter (HOSPITAL_COMMUNITY): Payer: Self-pay

## 2016-06-26 ENCOUNTER — Other Ambulatory Visit: Payer: Self-pay | Admitting: Obstetrics & Gynecology

## 2017-04-07 ENCOUNTER — Other Ambulatory Visit: Payer: Self-pay | Admitting: Obstetrics & Gynecology

## 2017-04-13 ENCOUNTER — Other Ambulatory Visit: Payer: Self-pay | Admitting: Adult Health

## 2017-05-03 ENCOUNTER — Other Ambulatory Visit (HOSPITAL_COMMUNITY)
Admission: RE | Admit: 2017-05-03 | Discharge: 2017-05-03 | Disposition: A | Discharge status: Home / Self Care | Payer: Self-pay | Source: Ambulatory Visit | Attending: Adult Health | Admitting: Adult Health

## 2017-05-03 ENCOUNTER — Encounter: Payer: Self-pay | Admitting: Adult Health

## 2017-05-03 ENCOUNTER — Ambulatory Visit (INDEPENDENT_AMBULATORY_CARE_PROVIDER_SITE_OTHER): Payer: Self-pay | Admitting: Adult Health

## 2017-05-03 VITALS — BP 140/80 | HR 74 | Ht 62.0 in | Wt 162.0 lb

## 2017-05-03 DIAGNOSIS — N921 Excessive and frequent menstruation with irregular cycle: Secondary | ICD-10-CM

## 2017-05-03 DIAGNOSIS — Z01411 Encounter for gynecological examination (general) (routine) with abnormal findings: Secondary | ICD-10-CM

## 2017-05-03 DIAGNOSIS — Z01419 Encounter for gynecological examination (general) (routine) without abnormal findings: Secondary | ICD-10-CM

## 2017-05-03 MED ORDER — MEGESTROL ACETATE 40 MG PO TABS: ORAL_TABLET | ORAL | 11 refills | Status: DC

## 2017-05-03 NOTE — Addendum Note (Signed)
Addended by: Federico Flake A on: 05/03/2017 04:18 PM   Modules accepted: Orders

## 2017-05-03 NOTE — Patient Instructions (Signed)
Physical in 1 year Pap in 3 if normal Try to get labs

## 2017-05-03 NOTE — Progress Notes (Signed)
Patient ID: Renee Bates, female   DOB: 02-May-1981, 36 y.o.   MRN: [id number] History of Present Illness: Renee Bates is a 36 year old black female in for well woman gyn exam and pap.   Current Medications, Allergies, Past Medical History, Past Surgical History, Family History and Social History were reviewed in [employer] record.     Review of Systems:  Patient denies any headaches, hearing loss, fatigue, blurred vision, shortness of breath, chest pain, abdominal pain, problems with bowel movements, urination, or intercourse. No joint pain or mood swings, Bleeds if doe not take megace.   Physical Exam:BP 140/80 (BP Location: Left Arm, Patient Position: Sitting, Cuff Size: Small)   Pulse 74   Ht  (1.575 m)   Wt 162 lb (73.5 kg)   BMI 29.63 kg/m  General:  Well developed, well nourished, no acute distress Skin:  Warm and dry Neck:  Midline trachea, normal thyroid, good ROM, no lymphadenopathy Lungs; Clear to auscultation bilaterally Breast:  No dominant palpable mass, retraction, or nipple discharge Cardiovascular: Regular rate and rhythm Abdomen:  Soft, non tender, no hepatosplenomegaly Pelvic:  External genitalia is normal in appearance, no lesions.  The vagina is normal in appearance. Urethra has no lesions or masses. The cervix is bulbous.Pap with HPV performed.  Uterus is felt to be normal size, shape, and contour.  No adnexal masses or tenderness noted.Bladder is non tender, no masses felt. Extremities/musculoskeletal:  No swelling or varicosities noted, no clubbing or cyanosis Psych:  No mood changes, alert and cooperative,seems happy PHQ 2 score 0.  Impression: 1. Encounter for gynecological examination with Papanicolaou smear of cervix   2. Menorrhagia with irregular cycle       Plan: Meds ordered this encounter  Medications  . megestrol (MEGACE) 40 MG tablet    Sig: Take 1 daily but stop for 1 week every 3 months    Dispense:  30 tablet   Refill:  11    Order Specific Question:   Supervising Provider    Answer:   Lazaro Arms [[id number]]  Physical in 1 year Pap in 3 if normal Try to get labs

## 2017-05-04 ENCOUNTER — Other Ambulatory Visit: Payer: Self-pay | Admitting: Adult Health

## 2017-05-06 ENCOUNTER — Telehealth: Payer: Self-pay | Admitting: Adult Health

## 2017-05-06 ENCOUNTER — Encounter: Payer: Self-pay | Admitting: Adult Health

## 2017-05-06 DIAGNOSIS — R87810 Cervical high risk human papillomavirus (HPV) DNA test positive: Secondary | ICD-10-CM

## 2017-05-06 HISTORY — DX: Cervical high risk human papillomavirus (HPV) DNA test positive: R87.810

## 2017-05-06 LAB — CYTOLOGY - PAP
Adequacy: ABSENT
Diagnosis: NEGATIVE
HPV: DETECTED — AB

## 2017-05-06 MED ORDER — METRONIDAZOLE 500 MG PO TABS: 500.0000 mg | ORAL_TABLET | Freq: Two times a day (BID) | ORAL | 0 refills | Status: DC

## 2017-05-06 NOTE — Telephone Encounter (Signed)
Pt aware pap negative for malignancy  but +HPV and BV, repeat pap in 1 year and will rx flagyl

## 2017-07-28 ENCOUNTER — Encounter (HOSPITAL_COMMUNITY): Payer: Self-pay

## 2017-09-15 ENCOUNTER — Encounter: Payer: Self-pay | Admitting: Advanced Practice Midwife

## 2017-09-15 ENCOUNTER — Ambulatory Visit (INDEPENDENT_AMBULATORY_CARE_PROVIDER_SITE_OTHER): Payer: Self-pay | Admitting: Advanced Practice Midwife

## 2017-09-15 VITALS — BP 138/80 | HR 78 | Ht 62.0 in | Wt 165.0 lb

## 2017-09-15 DIAGNOSIS — B9689 Other specified bacterial agents as the cause of diseases classified elsewhere: Secondary | ICD-10-CM

## 2017-09-15 DIAGNOSIS — N760 Acute vaginitis: Secondary | ICD-10-CM

## 2017-09-15 MED ORDER — METRONIDAZOLE 500 MG PO TABS: 500.0000 mg | ORAL_TABLET | Freq: Two times a day (BID) | ORAL | 0 refills | Status: DC

## 2017-09-15 NOTE — Progress Notes (Signed)
CHIEF COMPLAINT/HPI:  37 y.o. female complains of white vaginal discharge for a few days . Denies abnormal vaginal bleeding, significant pelvic pain or fever.  no itch, no irritation, slight odor. No UTI symptoms.   Denies history of known exposure to STD or symptoms in partner.  No LMP recorded. Patient is not currently having periods (Reason: Other). Megace.  Is self pay Has not tried an OTC product.   Review of Systems  Constitutional: Negative for fever and chills Eyes: Negative for visual disturbances Respiratory: Negative for shortness of breath, dyspnea Cardiovascular: Negative for chest pain or palpitations  Gastrointestinal: Negative for vomiting, diarrhea and constipation Genitourinary: Negative for dysuria and urgency Musculoskeletal: Negative for back pain, joint pain, myalgias  Neurological: Negative for dizziness and headaches    Past Medical History: Past Medical History:  Diagnosis Date  . Hypertension   . Obesity   . 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 Physical exam 03/30/2014  . Sleep apnea     Past Surgical History: Past Surgical History:  Procedure Laterality Date  . BREATH TEK H PYLORI N/A 10/13/2013   Procedure: BREATH TEK H PYLORI;  Surgeon: Valarie MerinoMatthew B Martin, MD;  [facility];  Service: General;  Laterality: N/A;  . CESAREAN SECTION  2003 (twins), 2007   x2  . 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 TUBAL LIGATION      Obstetrical History: OB History    Gravida Para Term Preterm AB Living   2 2       3    SAB TAB Ectopic Multiple Live Births         1 3      Social History: Social History   Socioeconomic History  . Marital status: Single    Spouse name: None  . Number of children: None  . Years of education: None  .  Highest education level: None  Social Needs  . Financial resource strain: None  . Food insecurity - worry: None  . Food insecurity - inability: None  . Transportation needs - medical: None  . Transportation needs - non-medical: None  Occupational History  . Occupation: Lobbyistcashier    Employer: [employer]  Tobacco Use  . Smoking status: Never Smoker  . Smokeless tobacco: Never Used  Substance and Sexual Activity  . Alcohol use: No  . Drug use: No  . Sexual activity: Yes    Birth control/protection: Surgical    Comment: tubal  Other Topics Concern  . None  Social History Narrative  . None    Family History: Family History  Problem Relation Age of Onset  . Diabetes Mother   . Hypertension Mother   . Asthma Son   . Diabetes Father   . Hypertension Father   . Hypertension Sister   . Hypertension Sister     Allergies: No Known Allergies      PHYSICAL EXAM: Physical Examination: General appearance - well appearing, and in no distress Mental status - alert, oriented to person, place, and time Chest:  Normal respiratory effort Heart - normal rate and regular rhythm Abdomen:  Soft, nontender Pelvic: SSE:  Scant amount discharge, wet prep few clue trich,, neg wbc, neg yeast Musculoskeletal:  Normal range of motion without pain Extremities:  No edema    Labs: No results found  for this or any previous visit (from the past 24 hour(s)).   Assessment: Patient Active Problem List   Diagnosis Date Noted  . 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 Physical exam 03/30/2014  . 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 S/P gastric bypass 12/25/2013  . Other disorder of menstruation and other abnormal bleeding from female genital tract 12/07/2013  . OSA (obstructive sleep apnea) 11/06/2013  . Hypertension 09/21/2013  . Morbid obesity with body mass index of 40.0-44.9 in adult [facility]) 09/21/2013    Plan:  No  orders of the defined types were placed in this encounter.  Flagyl 500mg  BID  CRESENZO-DISHMAN,Vickey Boak 09/15/17 9:27 AM

## 2017-09-15 NOTE — Patient Instructions (Addendum)
Bacterial Vaginosis Bacterial vaginosis is a vaginal infection that occurs when the normal balance of bacteria in the vagina is disrupted. It results from an overgrowth of certain bacteria. This is the most common vaginal infection among women ages 15-44. Because bacterial vaginosis increases your risk for STIs (sexually transmitted infections), getting treated can help reduce your risk for chlamydia, gonorrhea, herpes, and HIV (human immunodeficiency virus). Treatment is also important for preventing complications in pregnant women, because this condition can cause an early (premature) delivery. What are the causes? This condition is caused by an increase in harmful bacteria that are normally present in small amounts in the vagina. However, the reason that the condition develops is not fully understood. What increases the risk? The following factors may make you more likely to develop this condition:  Having a new sexual partner or multiple sexual partners.  Having unprotected sex.  Douching.  Having an intrauterine device (IUD).  Smoking.  Drug and alcohol abuse.  Taking certain antibiotic medicines.  Being pregnant.  You cannot get bacterial vaginosis from toilet seats, bedding, swimming pools, or contact with objects around you. What are the signs or symptoms? Symptoms of this condition include:  Grey or white vaginal discharge. The discharge can also be watery or foamy.  A fish-like odor with discharge, especially after sexual intercourse or during menstruation.  Itching in and around the vagina.  Burning or pain with urination.  Some women with bacterial vaginosis have no signs or symptoms. How is this diagnosed? This condition is diagnosed based on:  Your medical history.  A physical exam of the vagina.  Testing a sample of vaginal fluid under a microscope to look for a large amount of bad bacteria or abnormal cells. Your health care provider may use a cotton swab  or a small wooden spatula to collect the sample.  How is this treated? This condition is treated with antibiotics. These may be given as a pill, a vaginal cream, or a medicine that is put into the vagina (suppository). If the condition comes back after treatment, a second round of antibiotics may be needed. Follow these instructions at home: Medicines  Take over-the-counter and prescription medicines only as told by your health care provider.  Take or use your antibiotic as told by your health care provider. Do not stop taking or using the antibiotic even if you start to feel better. General instructions  If you have a female sexual partner, tell her that you have a vaginal infection. She should see her health care provider and be treated if she has symptoms. If you have a female sexual partner, he does not need treatment.  During treatment: ? Avoid sexual activity until you finish treatment. ? Do not douche. ? Avoid alcohol as directed by your health care provider. ? Avoid breastfeeding as directed by your health care provider.  Drink enough water and fluids to keep your urine clear or pale yellow.  Keep the area around your vagina and rectum clean. ? Wash the area daily with warm water. ? Wipe yourself from front to back after using the toilet.  Keep all follow-up visits as told by your health care provider. This is important. How is this prevented?  Do not douche.  Wash the outside of your vagina with warm water only.  Use protection when having sex. This includes latex condoms and dental dams.  Limit how many sexual partners you have. To help prevent bacterial vaginosis, it is best to have sex with just   one partner (monogamous).  Make sure you and your sexual partner are tested for STIs.  Wear cotton or cotton-lined underwear.  Avoid wearing tight pants and pantyhose, especially during summer.  Limit the amount of alcohol that you drink.  Do not use any products that  contain nicotine or tobacco, such as cigarettes and e-cigarettes. If you need help quitting, ask your health care provider.  Do not use illegal drugs. Where to find more information:  Centers for Disease Control and Prevention: SolutionApps.co.zawww.cdc.gov/std  American Sexual Health Association (ASHA): www.ashastd.org  U.S. Department of Health and Health and safety inspectorHuman Services, Office on Women's Health: ConventionalMedicines.siwww.womenshealth.gov/ or http://www.anderson-williamson.info/https://www.womenshealth.gov/a-z-topics/bacterial-vaginosis Contact a health care provider if:  Your symptoms do not improve, even after treatment.  You have more discharge or pain when urinating.  You have a fever.  You have pain in your abdomen.  You have pain during sex.  You have vaginal bleeding between periods. Summary  Bacterial vaginosis is a vaginal infection that occurs when the normal balance of bacteria in the vagina is disrupted.  Because bacterial vaginosis increases your risk for STIs (sexually transmitted infections), getting treated can help reduce your risk for chlamydia, gonorrhea, herpes, and HIV (human immunodeficiency virus). Treatment is also important for preventing complications in pregnant women, because the condition can cause an early (premature) delivery.  This condition is treated with antibiotic medicines. These may be given as a pill, a vaginal cream, or a medicine that is put into the vagina (suppository). This information is not intended to replace advice given to you by your health care provider. Make sure you discuss any questions you have with your health care provider. Document Released: 08/03/2005 Document Revised: 12/07/2016 Document Reviewed: 04/18/2016 Elsevier Interactive Patient Education  2018 Elsevier Inc.    Vaginal probiotic/ph balancer (oral or vaginal)  Vaginal 1-2/week    Endometrial ablation

## 2018-03-08 ENCOUNTER — Telehealth: Payer: Self-pay | Admitting: Advanced Practice Midwife

## 2018-03-08 NOTE — Telephone Encounter (Signed)
Patient states she is experiencing a vaginal discharge with odor, thinks she has BV again. Requesting refill on Flagyl.

## 2018-03-08 NOTE — Telephone Encounter (Signed)
Patient called stating that she would like from [facility] to call her in something for her infection. Pt states that fran has send a medication for it before and now she would like a refill. Please contact pt

## 2018-03-09 ENCOUNTER — Other Ambulatory Visit: Payer: Self-pay | Admitting: Advanced Practice Midwife

## 2018-03-09 MED ORDER — METRONIDAZOLE 500 MG PO TABS: 500.0000 mg | ORAL_TABLET | Freq: Two times a day (BID) | ORAL | 0 refills | Status: DC

## 2018-03-09 NOTE — Telephone Encounter (Signed)
Refill sent to pharmacy.   

## 2018-03-10 ENCOUNTER — Ambulatory Visit: Payer: Self-pay | Admitting: Advanced Practice Midwife

## 2018-03-23 ENCOUNTER — Ambulatory Visit (INDEPENDENT_AMBULATORY_CARE_PROVIDER_SITE_OTHER): Payer: Self-pay | Admitting: Obstetrics and Gynecology

## 2018-03-23 ENCOUNTER — Encounter: Payer: Self-pay | Admitting: Obstetrics and Gynecology

## 2018-03-23 VITALS — BP 157/96 | HR 68 | Ht 62.0 in | Wt 171.0 lb

## 2018-03-23 DIAGNOSIS — R3989 Other symptoms and signs involving the genitourinary system: Secondary | ICD-10-CM

## 2018-03-23 DIAGNOSIS — N3000 Acute cystitis without hematuria: Secondary | ICD-10-CM

## 2018-03-23 LAB — POCT URINALYSIS DIPSTICK
Glucose, UA: NEGATIVE
Nitrite, UA: POSITIVE
Protein, UA: POSITIVE — AB

## 2018-03-23 MED ORDER — SULFAMETHOXAZOLE-TRIMETHOPRIM 800-160 MG PO TABS: 1.0000 | ORAL_TABLET | Freq: Two times a day (BID) | ORAL | 1 refills | Status: DC

## 2018-03-23 NOTE — Patient Instructions (Signed)

## 2018-03-23 NOTE — Progress Notes (Signed)
Patient ID: Renee Bates, female   DOB: 02/24/1981, 37 y.o.   MRN: [id number]    [facility]  @DATE @            Patient name: Renee Bates MRN [id number]  Date of birth: 05/19/1981  CC & HPI:  Renee Bates is a 37 y.o. female presenting today for UTI. She has this UTI happened suddenly She takes birth control pills and stops every 3 months for a week to have a period ROS:  ROS +uti -fever -chills All systems are negative except as noted in the HPI and PMH.   Pertinent History Reviewed:   Reviewed: Significant for none Medical         Past Medical History:  Diagnosis Date  . Hypertension   . Obesity   . 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 Physical exam 03/30/2014  . Sleep apnea                               Surgical Hx:    Past Surgical History:  Procedure Laterality Date  . BREATH TEK H PYLORI N/A 10/13/2013   Procedure: BREATH TEK H PYLORI;  Surgeon: Valarie MerinoMatthew B Martin, MD;  [facility];  Service: General;  Laterality: N/A;  . CESAREAN SECTION  2003 (twins), 2007   x2  . 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 TUBAL LIGATION     Medications: Reviewed & Updated - see associated section                       Current Outpatient Medications:  .  megestrol (MEGACE) 40 MG tablet, Take 1 daily but stop for 1 week every 3 months, Disp: 30 tablet, Rfl: 11 .  metroNIDAZOLE (FLAGYL) 500 MG tablet, Take 1 tablet (500 mg total) by mouth 2 (two) times daily. (Patient not taking: Reported on 09/15/2017), Disp: 14 tablet, Rfl: 0 .  metroNIDAZOLE (FLAGYL) 500 MG tablet, Take 1 tablet (500 mg total) by mouth 2 (two) times daily. (Patient not taking: Reported on 03/23/2018), Disp: 14 tablet, Rfl: 0   Social History: Reviewed -  reports that she has  never smoked. She has never used smokeless tobacco.  Objective Findings:  Vitals: Blood pressure (!) 157/96, pulse 68, height 5\' 2"  (1.575 m), weight 171 lb (77.6 kg).  PHYSICAL EXAMINATION General appearance - alert, well appearing, and in no distress and oriented to person, place, and time Mental status - alert, oriented to person, place, and time, normal mood, behavior, speech, dress, motor activity, and thought processes, affect appropriate to mood  PELVIC NOT DONE  Abd suprapubic discomfort. Assessment & Plan:   A:  1.  UTI  P:  1.  Rx septra DS x 7d  Given 1 refil for recurrence(self pay)   By signing my name below, I, Arnette NorrisMari Johnson, attest that this documentation has been prepared under the direction and in the presence of Tilda BurrowFerguson, Haila Dena V, MD. Electronically Signed: Arnette NorrisMari Johnson Medical Scribe. 03/23/18. 10:36 AM.  I personally performed the services described in this documentation, which was SCRIBED in my presence. The recorded information has been reviewed and considered accurate. It has been edited as necessary during review. Tilda BurrowJohn V Samaya Boardley, MD

## 2018-03-25 LAB — URINE CULTURE

## 2018-06-15 ENCOUNTER — Other Ambulatory Visit: Payer: Self-pay | Admitting: Adult Health

## 2019-05-19 ENCOUNTER — Other Ambulatory Visit: Payer: Self-pay

## 2019-05-19 DIAGNOSIS — Z20822 Contact with and (suspected) exposure to covid-19: Secondary | ICD-10-CM

## 2019-05-20 LAB — NOVEL CORONAVIRUS, NAA: SARS-CoV-2, NAA: NOT DETECTED

## 2019-07-05 ENCOUNTER — Other Ambulatory Visit: Payer: Self-pay | Admitting: Adult Health

## 2019-08-07 ENCOUNTER — Other Ambulatory Visit: Payer: Self-pay | Admitting: Adult Health

## 2019-09-26 ENCOUNTER — Other Ambulatory Visit (HOSPITAL_COMMUNITY)
Admission: RE | Admit: 2019-09-26 | Discharge: 2019-09-26 | Disposition: A | Discharge status: Home / Self Care | Payer: BC Managed Care – PPO | Source: Ambulatory Visit | Attending: Adult Health | Admitting: Adult Health

## 2019-09-26 ENCOUNTER — Other Ambulatory Visit: Payer: Self-pay

## 2019-09-26 ENCOUNTER — Encounter: Payer: Self-pay | Admitting: Adult Health

## 2019-09-26 ENCOUNTER — Ambulatory Visit (INDEPENDENT_AMBULATORY_CARE_PROVIDER_SITE_OTHER): Payer: BC Managed Care – PPO | Admitting: Adult Health

## 2019-09-26 VITALS — BP 178/102 | HR 84 | Ht 62.0 in | Wt 202.0 lb

## 2019-09-26 DIAGNOSIS — Z01419 Encounter for gynecological examination (general) (routine) without abnormal findings: Secondary | ICD-10-CM

## 2019-09-26 DIAGNOSIS — I10 Essential (primary) hypertension: Secondary | ICD-10-CM | POA: Diagnosis not present

## 2019-09-26 MED ORDER — AMLODIPINE BESYLATE 5 MG PO TABS: 5.0000 mg | ORAL_TABLET | Freq: Every day | ORAL | 3 refills | Status: DC

## 2019-09-26 MED ORDER — MEGESTROL ACETATE 40 MG PO TABS: ORAL_TABLET | ORAL | 3 refills | Status: DC

## 2019-09-26 NOTE — Progress Notes (Signed)
Patient ID: Renee Bates, female   DOB: 03-19-1981, 39 y.o.   MRN: [id number] History of Present Illness: Renee Bates is a 39 year old black female, married, G2P3,with 1 adopted son, too, in for a well woman gyn exam and pap. Last pap was 05/03/17 negative for malignancy +HPV. She works at [employer]   Current Medications, Allergies, Past Medical History, Past Surgical History, Family History and Social History were reviewed in [employer] record.     Review of Systems: Patient denies any  hearing loss, blurred vision, shortness of breath, chest pain, abdominal pain, problems with bowel movements, urination, or intercourse. No joint pain or mood swings. Has headaches, at times esp over left eye.takes megace for period management. +tired.    Physical Exam:BP (!) 178/102 (BP Location: Left Arm, Patient Position: Sitting, Cuff Size: Large)   Pulse 84   Ht 5\' 2"  (1.575 m)   Wt 202 lb (91.6 kg)   BMI 36.95 kg/m  General:  Well developed, well nourished, no acute distress Skin:  Warm and dry Neck:  Midline trachea, normal thyroid, good ROM, no lymphadenopathy Lungs; Clear to auscultation bilaterally Breast:  No dominant palpable mass, retraction, or nipple discharge Cardiovascular: Regular rate and rhythm Abdomen:  Soft, non tender, no hepatosplenomegaly Pelvic:  External genitalia is normal in appearance, no lesions.  The vagina is normal in appearance. Urethra has no lesions or masses. The cervix is bulbous and smooth, pap with high risk HPV 16.18 genotyping performed.  Uterus is felt to be normal size, shape, and contour.  No adnexal masses or tenderness noted.Bladder is non tender, no masses felt. Extremities/musculoskeletal:  No swelling or varicosities noted, no clubbing or cyanosis Psych:  No mood changes, alert and cooperative,seems happy Fall risk is low PHQ 2 score is 0. Examination chaperoned and Co exam with NP.   Impression and Plan:   1.  Encounter for gynecological examination with Papanicolaou smear of cervix Pap sent  Physical in 1 year Pap in 3 if normal Mammogram at 39 Check CBC,CMP,TSH and lipids  2. Hypertension, unspecified type Will rx norvasc Meds ordered this encounter  Medications  . amLODipine (NORVASC) 5 MG tablet    Sig: Take 1 tablet (5 mg total) by mouth daily.    Dispense:  30 tablet    Refill:  3    Order Specific Question:   Supervising Provider    Answer:   Richelle Ito, LUTHER H [[id number]]  . megestrol (MEGACE) 40 MG tablet    Sig: Take 1 daily    Dispense:  30 tablet    Refill:  3    Order Specific Question:   Supervising Provider    Answer:   Despina Hidden H [[id number]]  Review handout on DASH diet and lets work on weight loss, of about a pound a week  Follow up in 4 weeks for BP check

## 2019-09-26 NOTE — Patient Instructions (Signed)
DASH Eating Plan DASH stands for "Dietary Approaches to Stop Hypertension." The DASH eating plan is a healthy eating plan that has been shown to reduce high blood pressure (hypertension). It may also reduce your risk for type 2 diabetes, heart disease, and stroke. The DASH eating plan may also help with weight loss. What are tips for following this plan?  General guidelines  Avoid eating more than 2,300 mg (milligrams) of salt (sodium) a day. If you have hypertension, you may need to reduce your sodium intake to 1,500 mg a day.  Limit alcohol intake to no more than 1 drink a day for nonpregnant women and 2 drinks a day for men. One drink equals 12 oz of beer, 5 oz of wine, or 1 oz of hard liquor.  Work with your health care provider to maintain a healthy body weight or to lose weight. Ask what an ideal weight is for you.  Get at least 30 minutes of exercise that causes your heart to beat faster (aerobic exercise) most days of the week. Activities may include walking, swimming, or biking.  Work with your health care provider or diet and nutrition specialist (dietitian) to adjust your eating plan to your individual calorie needs. Reading food labels   Check food labels for the amount of sodium per serving. Choose foods with less than 5 percent of the Daily Value of sodium. Generally, foods with less than 300 mg of sodium per serving fit into this eating plan.  To find whole grains, look for the word "whole" as the first word in the ingredient list. Shopping  Buy products labeled as "low-sodium" or "no salt added."  Buy fresh foods. Avoid canned foods and premade or frozen meals. Cooking  Avoid adding salt when cooking. Use salt-free seasonings or herbs instead of table salt or sea salt. Check with your health care provider or pharmacist before using salt substitutes.  Do not fry foods. Cook foods using healthy methods such as baking, boiling, grilling, and broiling instead.  Cook with  heart-healthy oils, such as olive, canola, soybean, or sunflower oil. Meal planning  Eat a balanced diet that includes: ? 5 or more servings of fruits and vegetables each day. At each meal, try to fill half of your plate with fruits and vegetables. ? Up to 6-8 servings of whole grains each day. ? Less than 6 oz of lean meat, poultry, or fish each day. A 3-oz serving of meat is about the same size as a deck of cards. One egg equals 1 oz. ? 2 servings of low-fat dairy each day. ? A serving of nuts, seeds, or beans 5 times each week. ? Heart-healthy fats. Healthy fats called Omega-3 fatty acids are found in foods such as flaxseeds and coldwater fish, like sardines, salmon, and mackerel.  Limit how much you eat of the following: ? Canned or prepackaged foods. ? Food that is high in trans fat, such as fried foods. ? Food that is high in saturated fat, such as fatty meat. ? Sweets, desserts, sugary drinks, and other foods with added sugar. ? Full-fat dairy products.  Do not salt foods before eating.  Try to eat at least 2 vegetarian meals each week.  Eat more home-cooked food and less restaurant, buffet, and fast food.  When eating at a restaurant, ask that your food be prepared with less salt or no salt, if possible. What foods are recommended? The items listed may not be a complete list. Talk with your dietitian about   what dietary choices are best for you. Grains Whole-grain or whole-wheat bread. Whole-grain or whole-wheat pasta. Brown rice. Oatmeal. Quinoa. Bulgur. Whole-grain and low-sodium cereals. Pita bread. Low-fat, low-sodium crackers. Whole-wheat flour tortillas. Vegetables Fresh or frozen vegetables (raw, steamed, roasted, or grilled). Low-sodium or reduced-sodium tomato and vegetable juice. Low-sodium or reduced-sodium tomato sauce and tomato paste. Low-sodium or reduced-sodium canned vegetables. Fruits All fresh, dried, or frozen fruit. Canned fruit in natural juice (without  added sugar). Meat and other protein foods Skinless chicken or turkey. Ground chicken or turkey. Pork with fat trimmed off. Fish and seafood. Egg whites. Dried beans, peas, or lentils. Unsalted nuts, nut butters, and seeds. Unsalted canned beans. Lean cuts of beef with fat trimmed off. Low-sodium, lean deli meat. Dairy Low-fat (1%) or fat-free (skim) milk. Fat-free, low-fat, or reduced-fat cheeses. Nonfat, low-sodium ricotta or cottage cheese. Low-fat or nonfat yogurt. Low-fat, low-sodium cheese. Fats and oils Soft margarine without trans fats. Vegetable oil. Low-fat, reduced-fat, or light mayonnaise and salad dressings (reduced-sodium). Canola, safflower, olive, soybean, and sunflower oils. Avocado. Seasoning and other foods Herbs. Spices. Seasoning mixes without salt. Unsalted popcorn and pretzels. Fat-free sweets. What foods are not recommended? The items listed may not be a complete list. Talk with your dietitian about what dietary choices are best for you. Grains Baked goods made with fat, such as croissants, muffins, or some breads. Dry pasta or rice meal packs. Vegetables Creamed or fried vegetables. Vegetables in a cheese sauce. Regular canned vegetables (not low-sodium or reduced-sodium). Regular canned tomato sauce and paste (not low-sodium or reduced-sodium). Regular tomato and vegetable juice (not low-sodium or reduced-sodium). Pickles. Olives. Fruits Canned fruit in a light or heavy syrup. Fried fruit. Fruit in cream or butter sauce. Meat and other protein foods Fatty cuts of meat. Ribs. Fried meat. Bacon. Sausage. Bologna and other processed lunch meats. Salami. Fatback. Hotdogs. Bratwurst. Salted nuts and seeds. Canned beans with added salt. Canned or smoked fish. Whole eggs or egg yolks. Chicken or turkey with skin. Dairy Whole or 2% milk, cream, and half-and-half. Whole or full-fat cream cheese. Whole-fat or sweetened yogurt. Full-fat cheese. Nondairy creamers. Whipped toppings.  Processed cheese and cheese spreads. Fats and oils Butter. Stick margarine. Lard. Shortening. Ghee. Bacon fat. Tropical oils, such as coconut, palm kernel, or palm oil. Seasoning and other foods Salted popcorn and pretzels. Onion salt, garlic salt, seasoned salt, table salt, and sea salt. Worcestershire sauce. Tartar sauce. Barbecue sauce. Teriyaki sauce. Soy sauce, including reduced-sodium. Steak sauce. Canned and packaged gravies. Fish sauce. Oyster sauce. Cocktail sauce. Horseradish that you find on the shelf. Ketchup. Mustard. Meat flavorings and tenderizers. Bouillon cubes. Hot sauce and Tabasco sauce. Premade or packaged marinades. Premade or packaged taco seasonings. Relishes. Regular salad dressings. Where to find more information:  National Heart, Lung, and Blood Institute: www.nhlbi.nih.gov  American Heart Association: www.heart.org Summary  The DASH eating plan is a healthy eating plan that has been shown to reduce high blood pressure (hypertension). It may also reduce your risk for type 2 diabetes, heart disease, and stroke.  With the DASH eating plan, you should limit salt (sodium) intake to 2,300 mg a day. If you have hypertension, you may need to reduce your sodium intake to 1,500 mg a day.  When on the DASH eating plan, aim to eat more fresh fruits and vegetables, whole grains, lean proteins, low-fat dairy, and heart-healthy fats.  Work with your health care provider or diet and nutrition specialist (dietitian) to adjust your eating plan to your   individual calorie needs. This information is not intended to replace advice given to you by your health care provider. Make sure you discuss any questions you have with your health care provider. Document Revised: 07/16/2017 Document Reviewed: 07/27/2016 Elsevier Patient Education  2020 Elsevier Inc.  

## 2019-09-27 LAB — TSH: TSH: 1.99 u[IU]/mL (ref 0.450–4.500)

## 2019-09-27 LAB — COMPREHENSIVE METABOLIC PANEL
ALT: 15 IU/L (ref 0–32)
AST: 17 IU/L (ref 0–40)
Albumin/Globulin Ratio: 1.9 (ref 1.2–2.2)
Albumin: 4.5 g/dL (ref 3.8–4.8)
Alkaline Phosphatase: 85 IU/L (ref 39–117)
BUN/Creatinine Ratio: 19 (ref 9–23)
BUN: 13 mg/dL (ref 6–20)
Bilirubin Total: 0.6 mg/dL (ref 0.0–1.2)
CO2: 20 mmol/L (ref 20–29)
Calcium: 9.3 mg/dL (ref 8.7–10.2)
Chloride: 104 mmol/L (ref 96–106)
Creatinine, Ser: 0.67 mg/dL (ref 0.57–1.00)
GFR calc Af Amer: 129 mL/min/{1.73_m2} (ref >59)
GFR calc non Af Amer: 112 mL/min/{1.73_m2} (ref >59)
Globulin, Total: 2.4 g/dL (ref 1.5–4.5)
Glucose: 77 mg/dL (ref 65–99)
Potassium: 3.8 mmol/L (ref 3.5–5.2)
Sodium: 140 mmol/L (ref 134–144)
Total Protein: 6.9 g/dL (ref 6.0–8.5)

## 2019-09-27 LAB — CBC
Hematocrit: 39.2 % (ref 34.0–46.6)
Hemoglobin: 13 g/dL (ref 11.1–15.9)
MCH: 30.9 pg (ref 26.6–33.0)
MCHC: 33.2 g/dL (ref 31.5–35.7)
MCV: 93 fL (ref 79–97)
Platelets: 417 10*3/uL (ref 150–450)
RBC: 4.21 x10E6/uL (ref 3.77–5.28)
RDW: 12.7 % (ref 11.7–15.4)
WBC: 5.4 10*3/uL (ref 3.4–10.8)

## 2019-09-27 LAB — LIPID PANEL
Chol/HDL Ratio: 2.8 ratio (ref 0.0–4.4)
Cholesterol, Total: 168 mg/dL (ref 100–199)
HDL: 61 mg/dL (ref >39)
LDL Chol Calc (NIH): 91 mg/dL (ref 0–99)
Triglycerides: 86 mg/dL (ref 0–149)
VLDL Cholesterol Cal: 16 mg/dL (ref 5–40)

## 2019-09-28 LAB — CYTOLOGY - PAP
Chlamydia: NEGATIVE
Comment: NEGATIVE
Comment: NEGATIVE
Comment: NORMAL
Diagnosis: NEGATIVE
High risk HPV: NEGATIVE
Neisseria Gonorrhea: NEGATIVE

## 2019-10-25 ENCOUNTER — Ambulatory Visit (INDEPENDENT_AMBULATORY_CARE_PROVIDER_SITE_OTHER): Payer: BC Managed Care – PPO | Admitting: Adult Health

## 2019-10-25 ENCOUNTER — Encounter: Payer: Self-pay | Admitting: Adult Health

## 2019-10-25 ENCOUNTER — Other Ambulatory Visit: Payer: Self-pay

## 2019-10-25 VITALS — BP 137/92 | HR 79 | Ht 62.0 in | Wt 208.2 lb

## 2019-10-25 DIAGNOSIS — I10 Essential (primary) hypertension: Secondary | ICD-10-CM | POA: Diagnosis not present

## 2019-10-25 MED ORDER — AMLODIPINE BESYLATE 5 MG PO TABS: 5.0000 mg | ORAL_TABLET | Freq: Every day | ORAL | 12 refills | Status: DC

## 2019-10-25 NOTE — Patient Instructions (Signed)
DASH Eating Plan DASH stands for "Dietary Approaches to Stop Hypertension." The DASH eating plan is a healthy eating plan that has been shown to reduce high blood pressure (hypertension). It may also reduce your risk for type 2 diabetes, heart disease, and stroke. The DASH eating plan may also help with weight loss. What are tips for following this plan?  General guidelines  Avoid eating more than 2,300 mg (milligrams) of salt (sodium) a day. If you have hypertension, you may need to reduce your sodium intake to 1,500 mg a day.  Limit alcohol intake to no more than 1 drink a day for nonpregnant women and 2 drinks a day for men. One drink equals 12 oz of beer, 5 oz of wine, or 1 oz of hard liquor.  Work with your health care provider to maintain a healthy body weight or to lose weight. Ask what an ideal weight is for you.  Get at least 30 minutes of exercise that causes your heart to beat faster (aerobic exercise) most days of the week. Activities may include walking, swimming, or biking.  Work with your health care provider or diet and nutrition specialist (dietitian) to adjust your eating plan to your individual calorie needs. Reading food labels   Check food labels for the amount of sodium per serving. Choose foods with less than 5 percent of the Daily Value of sodium. Generally, foods with less than 300 mg of sodium per serving fit into this eating plan.  To find whole grains, look for the word "whole" as the first word in the ingredient list. Shopping  Buy products labeled as "low-sodium" or "no salt added."  Buy fresh foods. Avoid canned foods and premade or frozen meals. Cooking  Avoid adding salt when cooking. Use salt-free seasonings or herbs instead of table salt or sea salt. Check with your health care provider or pharmacist before using salt substitutes.  Do not fry foods. Cook foods using healthy methods such as baking, boiling, grilling, and broiling instead.  Cook with  heart-healthy oils, such as olive, canola, soybean, or sunflower oil. Meal planning  Eat a balanced diet that includes: ? 5 or more servings of fruits and vegetables each day. At each meal, try to fill half of your plate with fruits and vegetables. ? Up to 6-8 servings of whole grains each day. ? Less than 6 oz of lean meat, poultry, or fish each day. A 3-oz serving of meat is about the same size as a deck of cards. One egg equals 1 oz. ? 2 servings of low-fat dairy each day. ? A serving of nuts, seeds, or beans 5 times each week. ? Heart-healthy fats. Healthy fats called Omega-3 fatty acids are found in foods such as flaxseeds and coldwater fish, like sardines, salmon, and mackerel.  Limit how much you eat of the following: ? Canned or prepackaged foods. ? Food that is high in trans fat, such as fried foods. ? Food that is high in saturated fat, such as fatty meat. ? Sweets, desserts, sugary drinks, and other foods with added sugar. ? Full-fat dairy products.  Do not salt foods before eating.  Try to eat at least 2 vegetarian meals each week.  Eat more home-cooked food and less restaurant, buffet, and fast food.  When eating at a restaurant, ask that your food be prepared with less salt or no salt, if possible. What foods are recommended? The items listed may not be a complete list. Talk with your dietitian about   what dietary choices are best for you. Grains Whole-grain or whole-wheat bread. Whole-grain or whole-wheat pasta. Brown rice. Oatmeal. Quinoa. Bulgur. Whole-grain and low-sodium cereals. Pita bread. Low-fat, low-sodium crackers. Whole-wheat flour tortillas. Vegetables Fresh or frozen vegetables (raw, steamed, roasted, or grilled). Low-sodium or reduced-sodium tomato and vegetable juice. Low-sodium or reduced-sodium tomato sauce and tomato paste. Low-sodium or reduced-sodium canned vegetables. Fruits All fresh, dried, or frozen fruit. Canned fruit in natural juice (without  added sugar). Meat and other protein foods Skinless chicken or turkey. Ground chicken or turkey. Pork with fat trimmed off. Fish and seafood. Egg whites. Dried beans, peas, or lentils. Unsalted nuts, nut butters, and seeds. Unsalted canned beans. Lean cuts of beef with fat trimmed off. Low-sodium, lean deli meat. Dairy Low-fat (1%) or fat-free (skim) milk. Fat-free, low-fat, or reduced-fat cheeses. Nonfat, low-sodium ricotta or cottage cheese. Low-fat or nonfat yogurt. Low-fat, low-sodium cheese. Fats and oils Soft margarine without trans fats. Vegetable oil. Low-fat, reduced-fat, or light mayonnaise and salad dressings (reduced-sodium). Canola, safflower, olive, soybean, and sunflower oils. Avocado. Seasoning and other foods Herbs. Spices. Seasoning mixes without salt. Unsalted popcorn and pretzels. Fat-free sweets. What foods are not recommended? The items listed may not be a complete list. Talk with your dietitian about what dietary choices are best for you. Grains Baked goods made with fat, such as croissants, muffins, or some breads. Dry pasta or rice meal packs. Vegetables Creamed or fried vegetables. Vegetables in a cheese sauce. Regular canned vegetables (not low-sodium or reduced-sodium). Regular canned tomato sauce and paste (not low-sodium or reduced-sodium). Regular tomato and vegetable juice (not low-sodium or reduced-sodium). Pickles. Olives. Fruits Canned fruit in a light or heavy syrup. Fried fruit. Fruit in cream or butter sauce. Meat and other protein foods Fatty cuts of meat. Ribs. Fried meat. Bacon. Sausage. Bologna and other processed lunch meats. Salami. Fatback. Hotdogs. Bratwurst. Salted nuts and seeds. Canned beans with added salt. Canned or smoked fish. Whole eggs or egg yolks. Chicken or turkey with skin. Dairy Whole or 2% milk, cream, and half-and-half. Whole or full-fat cream cheese. Whole-fat or sweetened yogurt. Full-fat cheese. Nondairy creamers. Whipped toppings.  Processed cheese and cheese spreads. Fats and oils Butter. Stick margarine. Lard. Shortening. Ghee. Bacon fat. Tropical oils, such as coconut, palm kernel, or palm oil. Seasoning and other foods Salted popcorn and pretzels. Onion salt, garlic salt, seasoned salt, table salt, and sea salt. Worcestershire sauce. Tartar sauce. Barbecue sauce. Teriyaki sauce. Soy sauce, including reduced-sodium. Steak sauce. Canned and packaged gravies. Fish sauce. Oyster sauce. Cocktail sauce. Horseradish that you find on the shelf. Ketchup. Mustard. Meat flavorings and tenderizers. Bouillon cubes. Hot sauce and Tabasco sauce. Premade or packaged marinades. Premade or packaged taco seasonings. Relishes. Regular salad dressings. Where to find more information:  National Heart, Lung, and Blood Institute: www.nhlbi.nih.gov  American Heart Association: www.heart.org Summary  The DASH eating plan is a healthy eating plan that has been shown to reduce high blood pressure (hypertension). It may also reduce your risk for type 2 diabetes, heart disease, and stroke.  With the DASH eating plan, you should limit salt (sodium) intake to 2,300 mg a day. If you have hypertension, you may need to reduce your sodium intake to 1,500 mg a day.  When on the DASH eating plan, aim to eat more fresh fruits and vegetables, whole grains, lean proteins, low-fat dairy, and heart-healthy fats.  Work with your health care provider or diet and nutrition specialist (dietitian) to adjust your eating plan to your   individual calorie needs. This information is not intended to replace advice given to you by your health care provider. Make sure you discuss any questions you have with your health care provider. Document Revised: 07/16/2017 Document Reviewed: 07/27/2016 Elsevier Patient Education  2020 Elsevier Inc.  

## 2019-10-25 NOTE — Progress Notes (Signed)
  Subjective:     Patient ID: Renee Bates, female   DOB: Aug 16, 1981, 39 y.o.   MRN: [id number]  HPI Renee Bates is a 39 year old black female,married G2P3 and an adopted son, in for BP check and ROS.  Review of Systems Feels better, fewer headaches  Reviewed past medical,surgical, social and family history. Reviewed medications and allergies.     Objective:   Physical Exam BP (!) 137/92 (BP Location: Left Arm, Patient Position: Sitting, Cuff Size: Large)   Pulse 79   Ht 5\' 2"  (1.575 m)   Wt 208 lb 3.2 oz (94.4 kg)   BMI 38.08 kg/m  Skin warm and dry. Lungs: clear to ausculation bilaterally. Cardiovascular: regular rate and rhythm.    Assessment:     1. Hypertension, unspecified type Continue norvasc  Meds ordered this encounter  Medications  . amLODipine (NORVASC) 5 MG tablet    Sig: Take 1 tablet (5 mg total) by mouth daily.    Dispense:  30 tablet    Refill:  12    Order Specific Question:   Supervising Provider    Answer:   [[id number]]      Plan:     Will reprint copy of DASH diet Try to lose a pound a week  Follow up in 3 months

## 2019-11-09 ENCOUNTER — Telehealth: Payer: Self-pay | Admitting: *Deleted

## 2019-11-09 MED ORDER — LOSARTAN POTASSIUM 50 MG PO TABS: 50.0000 mg | ORAL_TABLET | Freq: Every day | ORAL | 4 refills | Status: DC

## 2019-11-09 NOTE — Telephone Encounter (Signed)
Having leg cramps with Norvasc, stop Norvasc and start losartan  Potassium was normal in February

## 2019-11-09 NOTE — Telephone Encounter (Signed)
Pt left message that she is having cramping in her left leg. She was put on bp med at last visit and wonders if it is related to that. Or could it be her potassium.

## 2020-01-24 ENCOUNTER — Ambulatory Visit: Payer: BC Managed Care – PPO | Admitting: Adult Health

## 2020-01-25 ENCOUNTER — Ambulatory Visit: Payer: BC Managed Care – PPO | Admitting: Adult Health

## 2020-02-14 ENCOUNTER — Other Ambulatory Visit: Payer: Self-pay

## 2020-02-14 ENCOUNTER — Emergency Department (HOSPITAL_COMMUNITY): Admission: EM | Admit: 2020-02-14 | Discharge: 2020-02-14 | Discharge status: Against Medical Advice | Payer: Self-pay

## 2020-02-14 NOTE — ED Triage Notes (Signed)
Registration notified triage RN that pt left prior to being triaged.

## 2020-04-08 ENCOUNTER — Other Ambulatory Visit: Payer: Self-pay | Admitting: Adult Health

## 2020-05-07 ENCOUNTER — Other Ambulatory Visit: Payer: Self-pay

## 2020-06-15 ENCOUNTER — Emergency Department (HOSPITAL_COMMUNITY)
Admission: EM | Admit: 2020-06-15 | Discharge: 2020-06-15 | Disposition: A | Discharge status: Home / Self Care | Payer: HRSA Program | Attending: Emergency Medicine | Admitting: Emergency Medicine

## 2020-06-15 ENCOUNTER — Encounter (HOSPITAL_COMMUNITY): Payer: Self-pay

## 2020-06-15 ENCOUNTER — Other Ambulatory Visit: Payer: Self-pay

## 2020-06-15 ENCOUNTER — Emergency Department (HOSPITAL_COMMUNITY): Payer: HRSA Program

## 2020-06-15 DIAGNOSIS — I10 Essential (primary) hypertension: Secondary | ICD-10-CM | POA: Insufficient documentation

## 2020-06-15 DIAGNOSIS — U071 COVID-19: Secondary | ICD-10-CM | POA: Insufficient documentation

## 2020-06-15 DIAGNOSIS — J1282 Pneumonia due to coronavirus disease 2019: Secondary | ICD-10-CM | POA: Insufficient documentation

## 2020-06-15 DIAGNOSIS — Z79899 Other long term (current) drug therapy: Secondary | ICD-10-CM | POA: Diagnosis not present

## 2020-06-15 DIAGNOSIS — R058 Other specified cough: Secondary | ICD-10-CM | POA: Diagnosis present

## 2020-06-15 LAB — RESPIRATORY PANEL BY RT PCR (FLU A&B, COVID)
Influenza A by PCR: NEGATIVE
Influenza B by PCR: NEGATIVE
SARS Coronavirus 2 by RT PCR: POSITIVE — AB

## 2020-06-15 MED ORDER — FAMOTIDINE IN NACL 20-0.9 MG/50ML-% IV SOLN: 20.0000 mg | Freq: Once | INTRAVENOUS | Status: DC | PRN

## 2020-06-15 MED ORDER — EPINEPHRINE 0.3 MG/0.3ML IJ SOAJ: 0.3000 mg | Freq: Once | INTRAMUSCULAR | Status: DC | PRN

## 2020-06-15 MED ORDER — METHYLPREDNISOLONE SODIUM SUCC 125 MG IJ SOLR: 125.0000 mg | Freq: Once | INTRAMUSCULAR | Status: DC | PRN

## 2020-06-15 MED ORDER — SODIUM CHLORIDE 0.9 % IV SOLN
1200.0000 mg | Freq: Once | INTRAVENOUS | Status: AC
  Administered 2020-06-15: 1200 mg via INTRAVENOUS
  Filled 2020-06-15: qty 10

## 2020-06-15 MED ORDER — SODIUM CHLORIDE 0.9 % IV SOLN: INTRAVENOUS | Status: DC | PRN

## 2020-06-15 MED ORDER — AZITHROMYCIN 250 MG PO TABS: ORAL_TABLET | ORAL | 0 refills | Status: DC

## 2020-06-15 MED ORDER — ALBUTEROL SULFATE HFA 108 (90 BASE) MCG/ACT IN AERS: 2.0000 | INHALATION_SPRAY | Freq: Once | RESPIRATORY_TRACT | Status: DC | PRN

## 2020-06-15 MED ORDER — PREDNISONE 20 MG PO TABS: ORAL_TABLET | ORAL | 0 refills | Status: DC

## 2020-06-15 MED ORDER — DIPHENHYDRAMINE HCL 50 MG/ML IJ SOLN: 50.0000 mg | Freq: Once | INTRAMUSCULAR | Status: DC | PRN

## 2020-06-15 NOTE — ED Provider Notes (Signed)
[facility] EMERGENCY DEPARTMENT Provider Note   CSN: [id number] Arrival date & time: 06/15/20  0220   Time seen 3:36 AM  History Chief Complaint  Patient presents with  . Chest Pain    cough    Renee Bates is a 39 y.o. female.  HPI   Patient states about a week ago she started having a dry cough.  She is unaware fever or chills.  She states she did have a sore throat but mainly when she coughs.  She denies any loss of sense of taste or smell.  She denies nausea vomiting or diarrhea.  She states she has a central chest pain when she coughs.  She started feeling short of breath today.  She states that shortness of breath is only intermittent.  She denies being around anybody else who is sick.  Patient has not taken the Covid vaccine  PCP Tilda Burrow, MD   Past Medical History:  Diagnosis Date  . Hypertension   . Obesity   . 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 Physical exam 03/30/2014  . Sleep apnea     Patient Active Problem List   Diagnosis Date Noted  . Encounter for gynecological examination with Papanicolaou smear of cervix 09/26/2019  . 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 Physical exam 03/30/2014  . 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 S/P gastric bypass 12/25/2013  . Other disorder of menstruation and other abnormal bleeding from female genital tract 12/07/2013  . OSA (obstructive sleep apnea) 11/06/2013  . Hypertension 09/21/2013  . Morbid obesity with body mass index of 40.0-44.9 in adult [facility]) 09/21/2013    Past Surgical History:  Procedure Laterality Date  . BREATH TEK H PYLORI N/A 10/13/2013   Procedure: BREATH TEK H PYLORI;  Surgeon: Valarie Merino, MD;  Location: [facility];  Service: General;  Laterality: N/A;  . CESAREAN SECTION  2003 (twins), 2007   x2  . GASTRIC ROUX-EN-Y N/A 12/25/2013   Procedure:  LAPAROSCOPIC ROUX-EN-Y GASTRIC BYPASS WITH UPPER ENDOSCOPY;  Surgeon: Valarie Merino, MD;  Location: [facility];  Service: General;  Laterality: N/A;  . TUBAL LIGATION       OB History    Gravida  2   Para  2   Term      Preterm      AB      Living  3     SAB      TAB      Ectopic      Multiple  1   Live Births  3           Family History  Problem Relation Age of Onset  . Diabetes Mother   . Hypertension Mother   . Asthma Son   . Diabetes Father   . Hypertension Father   . Hypertension Sister   . Hypertension Sister     Social History   Tobacco Use  . Smoking status: Never Smoker  . Smokeless tobacco: Never Used  Vaping Use  . Vaping Use: Never used  Substance Use Topics  . Alcohol use: No  . Drug use: No  Employed in a convenience store  Home Medications Prior to Admission medications   Medication Sig Start Date End Date Taking? Authorizing Provider  azithromycin (ZITHROMAX) 250 MG tablet Take 2 po the first day then once a day  for the next 4 days. 06/15/20   Devoria Albe, MD  losartan (COZAAR) 50 MG tablet Take 1 tablet (50 mg total) by mouth daily. 11/09/19   Adline Potter, NP  megestrol (MEGACE) 40 MG tablet TAKE 1 TABLET BY MOUTH DAILY 04/08/20   Cyril Mourning A, NP  predniSONE (DELTASONE) 20 MG tablet Take 3 po QD x 3d , then 2 po QD x 3d then 1 po QD x 3d 06/15/20   Devoria Albe, MD    Allergies    Patient has no known allergies.  Review of Systems   Review of Systems  All other systems reviewed and are negative.   Physical Exam Updated Vital Signs BP 122/83   Pulse 81   Temp 100 F (37.8 C) (Oral)   Resp (!) 21   Ht 5\' 2"  (1.575 m)   Wt 95.3 kg   SpO2 98%   BMI 38.41 kg/m   Physical Exam Vitals and nursing note reviewed.  Constitutional:      General: She is not in acute distress.    Appearance: Normal appearance. She is obese. She is not toxic-appearing.  HENT:     Right Ear: External ear normal.     Left Ear:  External ear normal.     Nose: Nose normal.     Mouth/Throat:     Mouth: Mucous membranes are moist.     Pharynx: No oropharyngeal exudate or posterior oropharyngeal erythema.  Eyes:     Extraocular Movements: Extraocular movements intact.     Conjunctiva/sclera: Conjunctivae normal.     Pupils: Pupils are equal, round, and reactive to light.  Cardiovascular:     Rate and Rhythm: Normal rate and regular rhythm.  Pulmonary:     Effort: Pulmonary effort is normal. No respiratory distress.     Breath sounds: Normal breath sounds. No stridor. No wheezing, rhonchi or rales.  Musculoskeletal:        General: Normal range of motion.     Cervical back: Normal range of motion.  Skin:    General: Skin is warm and dry.     Findings: No rash.  Neurological:     General: No focal deficit present.     Mental Status: She is alert and oriented to person, place, and time.     Cranial Nerves: No cranial nerve deficit.  Psychiatric:        Mood and Affect: Mood normal.        Behavior: Behavior normal.        Thought Content: Thought content normal.     ED Results / Procedures / Treatments   Labs (all labs ordered are listed, but only abnormal results are displayed) Labs Reviewed  RESPIRATORY PANEL BY RT PCR (FLU A&B, COVID) - Abnormal; Notable for the following components:      Result Value   SARS Coronavirus 2 by RT PCR POSITIVE (*)    All other components within normal limits    EKG EKG Interpretation  Date/Time:  Saturday June 15 2020 02:31:52 EDT Ventricular Rate:  86 PR Interval:    QRS Duration: 96 QT Interval:  382 QTC Calculation: 457 R Axis:   35 Text Interpretation: Sinus rhythm Borderline T wave abnormalities Baseline wander Since last tracing 28 Sep 2013 T wave inversion less evident in inferior leads Confirmed by 30 Sep 2013 (Devoria Albe) on 06/15/2020 2:51:54 AM   Radiology DG Chest Port 1 View  Result Date: 06/15/2020 CLINICAL DATA:  Fever and cough EXAM: PORTABLE  CHEST 1 VIEW COMPARISON:  09/28/2013 FINDINGS: Hazy bilateral opacities, greatest at the left lung base. No pleural effusion or pneumothorax. IMPRESSION: Hazy bilateral opacities, greatest at the left lung base, likely infection. Electronically Signed   By: Deatra Robinson M.D.   On: 06/15/2020 04:38    Procedures Procedures (including critical care time)  Medications Ordered in ED Medications  0.9 %  sodium chloride infusion (has no administration in time range)  diphenhydrAMINE (BENADRYL) injection 50 mg (has no administration in time range)  famotidine (PEPCID) IVPB 20 mg premix (has no administration in time range)  methylPREDNISolone sodium succinate (SOLU-MEDROL) 125 mg/2 mL injection 125 mg (has no administration in time range)  albuterol (VENTOLIN HFA) 108 (90 Base) MCG/ACT inhaler 2 puff (has no administration in time range)  EPINEPHrine (EPI-PEN) injection 0.3 mg (has no administration in time range)  casirivimab-imdevimab (REGEN-COV) 1,200 mg in sodium chloride 0.9 % 110 mL IVPB (0 mg Intravenous Stopped 06/15/20 0537)    ED Course  I have reviewed the triage vital signs and the nursing notes.  Pertinent labs & imaging results that were available during my care of the patient were reviewed by me and considered in my medical decision making (see chart for details).    MDM Rules/Calculators/A&P                          4:30 AM patient's Covid test was positive.  When I review her x-rays it appears she is getting the bilateral infiltrates consistent with Covid pneumonia.  She is not hypoxic.  I talked to the patient and relayed this information to her.  She would like to go ahead and get the monoclonal antibodies.  Patient received her monoclonal antibodies without complications.  Her pulse ox remained 98 to 100% on room air.  Patient was discharged home with instructions for her Covid care.  Final Clinical Impression(s) / ED Diagnoses Final diagnoses:  Pneumonia due to COVID-19  virus    Rx / DC Orders ED Discharge Orders         Ordered    azithromycin (ZITHROMAX) 250 MG tablet        06/15/20 0448    predniSONE (DELTASONE) 20 MG tablet        06/15/20 0448        Over-the-counter zinc, vitamin D and C, aspirin 81 mg daily  Plan discharge  Devoria Albe, MD, Concha Pyo, MD 06/15/20 [id number]

## 2020-06-15 NOTE — Discharge Instructions (Signed)
Drink plenty of fluids.  Take the medication as prescribed.  You can take Mucinex DM over-the-counter for cough.  Take acetaminophen 650 mg plus Motrin 600 mg every 6 hours as needed for pain, fever or body aches.  Start taking zinc 50 mg once a day.  Take vitamin C and vitamin D daily.  Take aspirin 81 mg daily.  When you are in the drugstore get a monitor to put on your finger to measure your oxygen.  Your oxygen should be above 90%.  Return to the emergency department if you struggle to breathe or your oxygen gets below 80%.

## 2020-06-15 NOTE — ED Triage Notes (Signed)
Pt reports cough x 2 days. Pt reports awaking to coughing spell and chest pain. Pt says chest pain is worse with coughing. Pt reports clear sputum. Pt denies fever.

## 2020-06-15 NOTE — ED Notes (Signed)
Date and time results received: 06/15/20 4:25 AM(use smartphrase ".now" to insert current time)  Test: Covid Critical Value: positive  Name of Provider Notified: Dr Lynelle Doctor  Orders Received? Or Actions Taken?: see chart

## 2020-07-05 ENCOUNTER — Other Ambulatory Visit: Payer: Self-pay | Admitting: Adult Health

## 2020-09-04 ENCOUNTER — Other Ambulatory Visit: Payer: Self-pay | Admitting: Surgery

## 2020-09-04 DIAGNOSIS — Z9884 Bariatric surgery status: Secondary | ICD-10-CM

## 2020-09-05 ENCOUNTER — Ambulatory Visit
Admission: RE | Admit: 2020-09-05 | Discharge: 2020-09-05 | Disposition: A | Discharge status: Home / Self Care | Payer: 59 | Source: Ambulatory Visit | Attending: Surgery | Admitting: Surgery

## 2020-09-05 DIAGNOSIS — Z9884 Bariatric surgery status: Secondary | ICD-10-CM

## 2020-12-02 ENCOUNTER — Ambulatory Visit: Payer: 59 | Admitting: Skilled Nursing Facility1

## 2021-01-07 ENCOUNTER — Other Ambulatory Visit: Payer: Self-pay

## 2021-01-07 ENCOUNTER — Encounter: Payer: 59 | Attending: Surgery | Admitting: Skilled Nursing Facility1

## 2021-01-07 DIAGNOSIS — Z6841 Body Mass Index (BMI) 40.0 and over, adult: Secondary | ICD-10-CM | POA: Diagnosis present

## 2021-01-07 NOTE — Progress Notes (Signed)
Nutrition Assessment for Bariatric Surgery Medical Nutrition Therapy Appt Start Time: 11:15 End Time: 12:15  Patient was seen on 01/07/2021 for Pre-Operative Nutrition Assessment. Letter of approval faxed to [facility] bariatric surgery program coordinator on 01/07/2021.   Referral stated Supervised Weight Loss (SWL) visits needed: 6  Planned surgery: Revision of RYGB Pt expectation of surgery: to lose weight  Pt expectation of dietitian: none identified     NUTRITION ASSESSMENT   Anthropometrics  Start weight at [facility]: 231 lbs (date: 01/07/2021)  Height: 62 in BMI: 42.25 kg/m2     Clinical  Medical hx: HTN Medications: losartan, women's one a day, calcium  Labs:  Notable signs/symptoms: N/A Any previous deficiencies? No  Micronutrient Nutrition Focused Physical Exam: Hair: No issues observed Eyes: No issues observed Mouth: No issues observed Neck: No issues observed Nails: No issues observed Skin: No issues observed  Lifestyle & Dietary Hx  Pt states her pouch stretched out so she is getting it revised.  Pt states she drinks protein drins when she does not get her protein in stating when she eats thing she is not supposed to such as chicken.  Pt states her lowest weight she got to was 155-160 pounds with her original RYGB.  Pt states she gained weight due to stress eating from losing her father and then her husband getting sick from the cart return hitting him.   Pt states she does recognize when she is full and then eats past that feeling.  Pt states she has been doing about 20 minutes on the treadmill a couple times a week.   24-Hr Dietary Recall: 1-2 protein shakes daily First Meal: 2 boiled eggs + 2 bacon or sausage or protein shake Snack: crackers + protein drink Second Meal: fast food Snack: protein bar Third Meal: baked spaghetti + salad + bread or broccoli + cheese Snack:  Beverages: water + flavoring, lemonade, plain water   Estimated Energy  Needs Calories: 1500   NUTRITION DIAGNOSIS  Overweight/obesity ([place]-3.3) related to past poor dietary habits and physical inactivity as evidenced by patient w/ planned Revision of RYGB surgery following dietary guidelines for continued weight loss.    NUTRITION INTERVENTION  Nutrition counseling (C-1) and education (E-2) to facilitate bariatric surgery goals.  Educated pt on micronutrient deficiencies post surgery and strategies to mitigate that risk   Pre-Op Goals Reviewed with the Patient . Track food and beverage intake (pen and paper, MyFitness Pal, Baritastic app, etc.) . Make healthy food choices while monitoring portion sizes . Consume 3 meals per day or try to eat every 3-5 hours . Avoid concentrated sugars and fried foods . Keep sugar & fat in the single digits per serving on food labels . Practice CHEWING your food (aim for applesauce consistency) . Practice not drinking 15 minutes before, during, and 30 minutes after each meal and snack . Avoid all carbonated beverages (ex: soda, sparkling beverages)  . Limit caffeinated beverages (ex: coffee, tea, energy drinks) . Avoid all sugar-sweetened beverages (ex: regular soda, sports drinks)  . Avoid alcohol  . Aim for 64-100 ounces of FLUID daily (with at least half of fluid intake being plain water)  . Aim for at least 60-80 grams of PROTEIN daily . Look for a liquid protein source that contains ?15 g protein and ?5 g carbohydrate (ex: shakes, drinks, shots) . Make a list of non-food related activities . Physical activity is an important part of a healthy lifestyle so keep it moving! The goal is  to reach 150 minutes of exercise per week, including cardiovascular and weight baring activity. . Start taking the appropriate multivitamin and calcium regimen . listening to satisfaction cues . Aim for 30 mintues ont he treadmill  *Goals that are bolded indicate the pt would like to start working towards these  Handouts Provided  Include  . Bariatric Surgery handouts (Nutrition Visits, Pre-Op Goals, Protein Shakes, Vitamins & Minerals) . Detailed MyPlate  Learning Style & Readiness for Change Teaching method utilized: Visual & Auditory  Demonstrated degree of understanding via: Teach Back  Readiness Level: precontemplative Barriers to learning/adherence to lifestyle change: none identified   RD's Notes for Next Visit . Assess pts adherence to chosen goals      MONITORING & EVALUATION Dietary intake, weekly physical activity, body weight, and pre-op goals reached at next nutrition visit.    Next Steps  Patient is to follow up at [facility] for Pre-Op Class >2 weeks before surgery for further nutrition education.  For next SWL

## 2021-02-13 ENCOUNTER — Other Ambulatory Visit: Payer: Self-pay

## 2021-02-13 ENCOUNTER — Encounter: Payer: 59 | Attending: Surgery | Admitting: Skilled Nursing Facility1

## 2021-02-13 DIAGNOSIS — Z6841 Body Mass Index (BMI) 40.0 and over, adult: Secondary | ICD-10-CM | POA: Insufficient documentation

## 2021-02-13 NOTE — Progress Notes (Signed)
Supervised Weight Loss Visit Bariatric Nutrition Education  Planned Surgery: Revision of RYGB   1 out of 6 SWL Appointments    NUTRITION ASSESSMENT  Anthropometrics  Start weight at [facility]: 231 lbs (date: 01/07/2021)  Weight: 233 pounds BMI: 42.62 kg/m2     Clinical   Medical hx: HTN Medications: losartan, Labs:  Notable signs/symptoms: N/A Any previous deficiencies? No  Lifestyle & Dietary Hx  Pt states she has been trying to avoid bread since last visit.  Pt states she is excited to get away for the 4th to [employer]   Estimated daily fluid intake: 64 oz Supplements: women's one a day Current average weekly physical activity: sometimes on the treadmill with the goal being 3-4 days a week 30 minutes   24-Hr Dietary Recall First Meal: eggs + bacon Snack: slim jim + cheese Second Meal: tuna + crackers  Snack: granola bar  Third Meal: chicken wings + broccoli and cheese Snack:  Beverages: water, gingerale   Estimated Energy Needs Calories: 1200   NUTRITION DIAGNOSIS  Overweight/obesity ([place]-3.3) related to past poor dietary habits and physical inactivity as evidenced by patient w/ planned revision RYGB surgery following dietary guidelines for continued weight loss.   NUTRITION INTERVENTION  Nutrition counseling (C-1) and education (E-2) to facilitate bariatric surgery goals.  Pre-Op Goals Progress & New Goals NEW: Take 3 calcium 2 hours apart NEW: Take the appropriate multivitamin   NEW: eat non starchy vegetables 2 times a day 7 days a week  Handouts Provided Include  Vitamins/minerals  Learning Style & Readiness for Change Teaching method utilized: Visual & Auditory  Demonstrated degree of understanding via: Teach Back  Readiness Level: pre-contemplative Barriers to learning/adherence to lifestyle change: unknown  RD's Notes for next Visit  Assess pts adherence to chosen goals   MONITORING & EVALUATION Dietary intake, weekly physical  activity, body weight, and pre-op goals in 1 month.   Next Steps  Patient is to return to [facility] for next appt

## 2021-02-17 ENCOUNTER — Other Ambulatory Visit: Payer: Self-pay | Admitting: Adult Health

## 2021-03-12 ENCOUNTER — Encounter: Payer: 59 | Attending: Surgery | Admitting: Skilled Nursing Facility1

## 2021-03-12 ENCOUNTER — Other Ambulatory Visit: Payer: Self-pay

## 2021-03-12 DIAGNOSIS — Z6841 Body Mass Index (BMI) 40.0 and over, adult: Secondary | ICD-10-CM | POA: Insufficient documentation

## 2021-03-12 NOTE — Progress Notes (Signed)
Supervised Weight Loss Visit Bariatric Nutrition Education  Planned Surgery: Revision of RYGB   2 out of 6 SWL Appointments    NUTRITION ASSESSMENT  Anthropometrics  Start weight at [facility]: 231 lbs (date: 01/07/2021)  Weight: 235 pounds BMI: 43.15 kg/m2     Clinical   Medical hx: HTN Medications: losartan, Labs:  Notable signs/symptoms: N/A Any previous deficiencies? No  Lifestyle & Dietary Hx  Pt states she did not add in non starchy vegetables with consistency.   Estimated daily fluid intake: 64 oz Supplements: women's one a day Current average weekly physical activity: sometimes on the treadmill with the goal being 3-4 days a week 30 minutes   24-Hr Dietary Recall: sometimes protein shakes First Meal: eggs + bacon or granola bar Snack: slim jim + cheese Second Meal: tuna + crackers or hamburger with half bun Snack: granola bar  Third Meal: chicken wings + broccoli and cheese or hot dog + ketchup Snack:  Beverages: water, gingerale, juice  Estimated Energy Needs Calories: 1200   NUTRITION DIAGNOSIS  Overweight/obesity ([place]-3.3) related to past poor dietary habits and physical inactivity as evidenced by patient w/ planned revision RYGB surgery following dietary guidelines for continued weight loss.   NUTRITION INTERVENTION  Nutrition counseling (C-1) and education (E-2) to facilitate bariatric surgery goals.  Pre-Op Goals Progress & New Goals continue: Take 3 calcium 2 hours apart Continue: Take the appropriate multivitamin   Continue/NEW: eat non starchy vegetables 2 times a day 7 days a week NEW: only water to drink throughout the week, avoid sugary beverages  Handouts Previously Provided Include  Vitamins/minerals Meal ideas  Learning Style & Readiness for Change Teaching method utilized: Visual & Auditory  Demonstrated degree of understanding via: Teach Back  Readiness Level: pre-contemplative Barriers to learning/adherence to lifestyle change:  unknown  RD's Notes for next Visit  Assess pts adherence to chosen goals   MONITORING & EVALUATION Dietary intake, weekly physical activity, body weight, and pre-op goals in 1 month.   Next Steps  Patient is to return to [facility] for next appt

## 2021-03-27 ENCOUNTER — Other Ambulatory Visit: Payer: Self-pay | Admitting: Adult Health

## 2021-04-15 ENCOUNTER — Other Ambulatory Visit: Payer: Self-pay

## 2021-04-15 ENCOUNTER — Encounter: Payer: 59 | Attending: Surgery | Admitting: Skilled Nursing Facility1

## 2021-04-15 DIAGNOSIS — Z6841 Body Mass Index (BMI) 40.0 and over, adult: Secondary | ICD-10-CM | POA: Insufficient documentation

## 2021-04-15 NOTE — Progress Notes (Signed)
Supervised Weight Loss Visit Bariatric Nutrition Education  Planned Surgery: Revision of RYGB   3 out of 6 SWL Appointments    NUTRITION ASSESSMENT  Anthropometrics  Start weight at [facility]: 231 lbs (date: 01/07/2021)  Weight: 227 pounds BMI: 41.52 kg/m2     Clinical   Medical hx: HTN Medications: losartan, Labs:  Notable signs/symptoms: N/A Any previous deficiencies? No  Lifestyle & Dietary Hx  Pt states she has cut out all caloric beverages and stopped eating past 7-8pm. Pt state she has cut back on sweets throughout the week.   Estimated daily fluid intake: 64 oz Supplements: women's one a day Current average weekly physical activity: sometimes on the treadmill with the goal being 2-3 days a week 30 minutes   24-Hr Dietary Recall: sometimes protein shakes First Meal: eggs + bacon or granola bar Snack: slim jim + cheese Second Meal: salad Snack: almonds Third Meal: salmon + asparagus Snack:  Beverages: water  Estimated Energy Needs Calories: 1200   NUTRITION DIAGNOSIS  Overweight/obesity ([place]-3.3) related to past poor dietary habits and physical inactivity as evidenced by patient w/ planned revision RYGB surgery following dietary guidelines for continued weight loss.   NUTRITION INTERVENTION  Nutrition counseling (C-1) and education (E-2) to facilitate bariatric surgery goals.  Pre-Op Goals Progress & New Goals continue: Take 3 calcium 2 hours apart Continue: Take the appropriate multivitamin   Continue: eat non starchy vegetables 2 times a day 7 days a week continue: only water to drink throughout the week, avoid sugary beverages  Handouts Previously Provided Include  Vitamins/minerals Meal ideas  Learning Style & Readiness for Change Teaching method utilized: Visual & Auditory  Demonstrated degree of understanding via: Teach Back  Readiness Level: pre-contemplative Barriers to learning/adherence to lifestyle change: unknown  RD's Notes for next Visit   Assess pts adherence to chosen goals   MONITORING & EVALUATION Dietary intake, weekly physical activity, body weight, and pre-op goals in 1 month.   Next Steps  Patient is to return to [facility] for next appt

## 2021-05-15 ENCOUNTER — Other Ambulatory Visit: Payer: Self-pay

## 2021-05-15 ENCOUNTER — Encounter: Payer: 59 | Attending: Surgery | Admitting: Skilled Nursing Facility1

## 2021-05-15 DIAGNOSIS — Z6841 Body Mass Index (BMI) 40.0 and over, adult: Secondary | ICD-10-CM | POA: Diagnosis present

## 2021-05-15 NOTE — Progress Notes (Signed)
Supervised Weight Loss Visit Bariatric Nutrition Education  Planned Surgery: Revision of RYGB   4 out of 6 SWL Appointments    NUTRITION ASSESSMENT  Anthropometrics  Start weight at [facility]: 231 lbs (date: 01/07/2021)  Weight: 231.6 pounds BMI: 42.36 kg/m2     Clinical   Medical hx: HTN Medications: losartan, Labs:  Notable signs/symptoms: N/A Any previous deficiencies? No  Lifestyle & Dietary Hx  Pt states she gained the weight due to pe-celebrating her birthday. Pt states she has been drinking more energy drinks now due to working more.    Estimated daily fluid intake: 64 oz Supplements: women's one a day Current average weekly physical activity: sometimes on the treadmill with the goal being 2-3 days a week 30 minutes   24-Hr Dietary Recall: sometimes protein shakes First Meal: eggs + bacon or granola bar Snack: slim jim + cheese Second Meal: salad or bologna sandwich with lettuce and tomato Snack: almonds Third Meal: salmon + asparagus or chicken alfredo + green beans Snack:  Beverages: water, energy drinks  Estimated Energy Needs Calories: 1200   NUTRITION DIAGNOSIS  Overweight/obesity ([place]-3.3) related to past poor dietary habits and physical inactivity as evidenced by patient w/ planned revision RYGB surgery following dietary guidelines for continued weight loss.   NUTRITION INTERVENTION  Nutrition counseling (C-1) and education (E-2) to facilitate bariatric surgery goals.  Pre-Op Goals Progress & New Goals continue: Take 3 calcium 2 hours apart Continue: Take the appropriate multivitamin   Continue: eat non starchy vegetables 2 times a day 7 days a week continue: only water to drink throughout the week, avoid sugary beverages NEW: lay off energy drinks NEW: walk the dogs for 20 minutes 3 days a week consisently   Handouts Previously Provided Include  Vitamins/minerals Meal ideas  Learning Style & Readiness for Change Teaching method utilized: Visual  & Auditory  Demonstrated degree of understanding via: Teach Back  Readiness Level: pre-contemplative Barriers to learning/adherence to lifestyle change: unknown  RD's Notes for next Visit  Assess pts adherence to chosen goals   MONITORING & EVALUATION Dietary intake, weekly physical activity, body weight, and pre-op goals in 1 month.   Next Steps  Patient is to return to [facility] for next appt

## 2021-06-11 ENCOUNTER — Other Ambulatory Visit: Payer: Self-pay

## 2021-06-11 ENCOUNTER — Encounter: Payer: 59 | Attending: Surgery | Admitting: Skilled Nursing Facility1

## 2021-06-11 DIAGNOSIS — Z6841 Body Mass Index (BMI) 40.0 and over, adult: Secondary | ICD-10-CM | POA: Insufficient documentation

## 2021-06-11 NOTE — Progress Notes (Signed)
Supervised Weight Loss Visit Bariatric Nutrition Education  Planned Surgery: Revision of RYGB   5 out of 6 [facility] Appointments    NUTRITION ASSESSMENT  Anthropometrics  Start weight at [facility]: 231 lbs (date: 01/07/2021)  Weight: 233 pounds BMI: 42.67 kg/m2     Clinical   Medical hx: HTN Medications: losartan, Labs:  Notable signs/symptoms: N/A Any previous deficiencies? No  Lifestyle & Dietary Hx  Pt states since adding in more vegetables she has more energy and is not as tired.  Pt states she is picking up extra hours at work.   Estimated daily fluid intake: 64 oz Supplements: women's one a day Current average weekly physical activity: some walking  24-Hr Dietary Recall: sometimes protein shakes First Meal: eggs + bacon or granola bar Snack: slim jim + cheese Second Meal: salad or bologna sandwich with lettuce and tomato Snack: almonds or peanut butter crackers Third Meal: salmon + asparagus or chicken alfredo + green beans or spagetti + salad Snack:  Beverages: water, cut down to the smaller cans energy drinks  Estimated Energy Needs Calories: 1200   NUTRITION DIAGNOSIS  Overweight/obesity ([place]-3.3) related to past poor dietary habits and physical inactivity as evidenced by patient w/ planned revision RYGB surgery following dietary guidelines for continued weight loss.   NUTRITION INTERVENTION  Nutrition counseling (C-1) and education (E-2) to facilitate bariatric surgery goals.  Pre-Op Goals Progress & New Goals continue: Take 3 calcium 2 hours apart Continue: Take the appropriate multivitamin   Continue: eat non starchy vegetables 2 times a day 7 days a week continue: only water to drink throughout the week, avoid sugary beverages continue: lay off energy drinks continue: walk the dogs for 20 minutes 3 days a week consisently   Handouts Previously Provided Include  Vitamins/minerals Meal ideas  Learning Style & Readiness for Change Teaching method  utilized: Visual & Auditory  Demonstrated degree of understanding via: Teach Back  Readiness Level: pre-contemplative Barriers to learning/adherence to lifestyle change: unknown  RD's Notes for next Visit  Assess pts adherence to chosen goals   MONITORING & EVALUATION Dietary intake, weekly physical activity, body weight, and pre-op goals in 1 month.   Next Steps  Patient is to return to [facility] for next appt

## 2021-07-15 ENCOUNTER — Encounter: Payer: 59 | Attending: Surgery | Admitting: Skilled Nursing Facility1

## 2021-07-15 ENCOUNTER — Other Ambulatory Visit: Payer: Self-pay

## 2021-07-15 DIAGNOSIS — Z6841 Body Mass Index (BMI) 40.0 and over, adult: Secondary | ICD-10-CM | POA: Diagnosis present

## 2021-07-15 NOTE — Progress Notes (Signed)
Supervised Weight Loss Visit Bariatric Nutrition Education  Planned Surgery: Revision of RYGB   6 out of 6 [facility] Appointments   Pt completed visits.   Pt has cleared nutrition requirements.   NUTRITION ASSESSMENT  Anthropometrics  Start weight at [facility]: 231 lbs (date: 01/07/2021)  Weight: 239 pounds BMI: 43.71 kg/m2     Clinical   Medical hx: HTN Medications: losartan, Labs:  Notable signs/symptoms: N/A Any previous deficiencies? No  Lifestyle & Dietary Hx  Pt states she has learned in the last few months to ensure she eats non satrchyc vegetables and to chew well.   Pt states she feels she will really need to focus in on eating the right amount and measuring.    Estimated daily fluid intake: 64 oz Supplements: women's one a day Current average weekly physical activity: ADL's  24-Hr Dietary Recall: sometimes protein shakes First Meal: eggs + bacon or granola bar Snack: slim jim + cheese Second Meal: salad or bologna sandwich with lettuce and tomato Snack: almonds or peanut butter crackers Third Meal: salmon + asparagus or chicken alfredo + green beans or spagetti + salad Snack:  Beverages: water, cut down to the smaller cans energy drinks  Estimated Energy Needs Calories: 1200   NUTRITION DIAGNOSIS  Overweight/obesity (-3.3) related to past poor dietary habits and physical inactivity as evidenced by patient w/ planned revision RYGB surgery following dietary guidelines for continued weight loss.   NUTRITION INTERVENTION  Nutrition counseling (C-1) and education (E-2) to facilitate bariatric surgery goals.  Pre-Op Goals Progress & New Goals continue: Take 3 calcium 2 hours apart Continue: Take the appropriate multivitamin   Continue: eat non starchy vegetables 2 times a day 7 days a week continue: only water to drink throughout the week, avoid sugary beverages continue: lay off energy drinks continue: walk the dogs for 20 minutes 3 days a week consisently    Handouts Previously Provided Include  Vitamins/minerals Meal ideas  Learning Style & Readiness for Change Teaching method utilized: Visual & Auditory  Demonstrated degree of understanding via: Teach Back  Readiness Level: pre-contemplative Barriers to learning/adherence to lifestyle change: unknown    MONITORING & EVALUATION Dietary intake, weekly physical activity, body weight, and pre-op goals  Next Steps  Patient is to return to [facility] for next appt Pt has completed visits. No further supervised visits required/recomended

## 2021-07-27 ENCOUNTER — Other Ambulatory Visit: Payer: Self-pay | Admitting: Adult Health

## 2021-08-04 ENCOUNTER — Other Ambulatory Visit: Payer: Self-pay

## 2021-08-04 ENCOUNTER — Encounter: Payer: 59 | Attending: Surgery | Admitting: Skilled Nursing Facility1

## 2021-08-04 DIAGNOSIS — Z6841 Body Mass Index (BMI) 40.0 and over, adult: Secondary | ICD-10-CM | POA: Insufficient documentation

## 2021-08-04 NOTE — Progress Notes (Signed)
Pre-Operative Nutrition Class:    Patient was seen on 08/04/2021 for Pre-Operative Bariatric Surgery Education at [facility].    Surgery date:  Surgery type: Revision of RYGB Start weight at [facility]: 239 Weight today: 244  Samples given per MNT protocol. Patient educated on appropriate usage: Ensure max exp: October 15, 2021 Ensure max lot: [id number]  Chewable bariatric advantage: advanced multi EA exp: 08/23 Chewable bariatric advantage: advanced multi EA lot: [id number]  Bariatric advantage calcium citrate exp: 02/23 Bariatric advantage calcium citrate lot: [id number]   The following the learning objectives were met by the patient during this course: Identify Pre-Op Dietary Goals and will begin 2 weeks pre-operatively Identify appropriate sources of fluids and proteins  State protein recommendations and appropriate sources pre and post-operatively Identify Post-Operative Dietary Goals and will follow for 2 weeks post-operatively Identify appropriate multivitamin and calcium sources Describe the need for physical activity post-operatively and will follow MD recommendations State when to call healthcare provider regarding medication questions or post-operative complications When having a diagnosis of diabetes understanding hypoglycemia symptoms and the inclusion of 1 complex carbohydrate per meal  Handouts given during class include: Pre-Op Bariatric Surgery Diet Handout Protein Shake Handout Post-Op Bariatric Surgery Nutrition Handout BELT Program Information Flyer Support Group Information Flyer [facility] Bariatric Supplements Price List  Follow-Up Plan: Patient will follow-up at [facility] 2 weeks post operatively for diet advancement per MD.

## 2021-08-05 ENCOUNTER — Other Ambulatory Visit: Payer: Self-pay | Admitting: Adult Health

## 2021-09-17 ENCOUNTER — Ambulatory Visit (INDEPENDENT_AMBULATORY_CARE_PROVIDER_SITE_OTHER): Payer: 59 | Admitting: Adult Health

## 2021-09-17 ENCOUNTER — Encounter: Payer: Self-pay | Admitting: Adult Health

## 2021-09-17 ENCOUNTER — Other Ambulatory Visit: Payer: Self-pay

## 2021-09-17 VITALS — BP 146/93 | HR 78 | Ht 62.0 in | Wt 242.0 lb

## 2021-09-17 DIAGNOSIS — Z713 Dietary counseling and surveillance: Secondary | ICD-10-CM | POA: Diagnosis not present

## 2021-09-17 DIAGNOSIS — Z6841 Body Mass Index (BMI) 40.0 and over, adult: Secondary | ICD-10-CM

## 2021-09-17 DIAGNOSIS — Z9884 Bariatric surgery status: Secondary | ICD-10-CM | POA: Diagnosis not present

## 2021-09-17 MED ORDER — PHENTERMINE HCL 37.5 MG PO TABS: 37.5000 mg | ORAL_TABLET | Freq: Every day | ORAL | 0 refills | Status: DC
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
## 2021-09-22 ENCOUNTER — Ambulatory Visit: Payer: 59 | Admitting: Adult Health

## 2021-10-13 ENCOUNTER — Ambulatory Visit: Payer: 59

## 2021-10-14 NOTE — Progress Notes (Signed)
Sent message, via epic in basket, requesting orders in epic from surgeon.  

## 2021-10-15 ENCOUNTER — Encounter: Payer: 59 | Admitting: Adult Health

## 2021-10-15 ENCOUNTER — Ambulatory Visit: Payer: 59 | Admitting: Adult Health

## 2021-10-15 NOTE — Progress Notes (Addendum)
Anesthesia Review: ? ?PCP: NONE  ?Cardiologist : NONE  ?Chest x-ray : ?EKG : 10/21/21  ?Echo : ?Stress test: ?Cardiac Cath :  ?Activity level: CAN DO A FLGIHT OF STAIRS WITHOUT DIFFICULTY  ?Sleep Study/ CPAP : MILD NO CPAP PER PT  ?Fasting Blood Sugar :      / Checks Blood Sugar -- times a day:   ?Blood Thinner/ Instructions /Last Dose: ?ASA / Instructions/ Last Dose :   ?COVID TEST ON 10/31/21 AT [id number]  ?BLOOD PRESSURE WAS IN LEFT ARM 177/121 PT DENIES ANY CHEST PAIN, DIZZINESS, HEADACHE OR SHORTNESS OF BREATH.  PT STATED SHE DRANK A RED BULL PRIOR TO PREOP.  BLOOD PRESSSURE IN RIGHT ARM WAS 176/102.  ?JESSICA WARD,PAC MADE AWARE. ALSO AWARE OF EKG TRACING.    NO NEW ORDERS GIVEN.  PT AWARE TO KEEP CHECK ON BLOOD PRESSURE AT HOME AND TO BE IN CONTACT WITH PROVIDER WHO REFILLS HER HTN MED AND TO MAKE APPT TO SEE THEM. PT VOICED UNDERSTANDING.  ?LAST DOSE OF PHENTERMINE WAS APPROXIMATELY ON 10/15/2021 PER PT.   ?

## 2021-10-16 NOTE — Progress Notes (Signed)
This encounter was created in error - please disregard.

## 2021-10-20 ENCOUNTER — Ambulatory Visit: Payer: Self-pay | Admitting: Surgery

## 2021-10-20 DIAGNOSIS — R635 Abnormal weight gain: Secondary | ICD-10-CM

## 2021-10-20 DIAGNOSIS — Z9884 Bariatric surgery status: Secondary | ICD-10-CM

## 2021-10-21 ENCOUNTER — Other Ambulatory Visit: Payer: Self-pay

## 2021-10-21 ENCOUNTER — Encounter (HOSPITAL_COMMUNITY)
Admission: RE | Admit: 2021-10-21 | Discharge: 2021-10-21 | Disposition: A | Discharge status: Home / Self Care | Payer: 59 | Source: Ambulatory Visit | Attending: Surgery | Admitting: Surgery

## 2021-10-21 ENCOUNTER — Encounter (HOSPITAL_COMMUNITY): Payer: Self-pay

## 2021-10-21 VITALS — BP 176/102 | HR 76 | Temp 98.2°F | Resp 16 | Ht 62.0 in

## 2021-10-21 DIAGNOSIS — R635 Abnormal weight gain: Secondary | ICD-10-CM | POA: Insufficient documentation

## 2021-10-21 DIAGNOSIS — Z01818 Encounter for other preprocedural examination: Secondary | ICD-10-CM | POA: Diagnosis not present

## 2021-10-21 DIAGNOSIS — Z9884 Bariatric surgery status: Secondary | ICD-10-CM

## 2021-10-21 LAB — CBC WITH DIFFERENTIAL/PLATELET
Abs Immature Granulocytes: 0.01 10*3/uL (ref 0.00–0.07)
Basophils Absolute: 0.1 10*3/uL (ref 0.0–0.1)
Basophils Relative: 1 %
Eosinophils Absolute: 0.1 10*3/uL (ref 0.0–0.5)
Eosinophils Relative: 1 %
HCT: 36.7 % (ref 36.0–46.0)
Hemoglobin: 11.8 g/dL — ABNORMAL LOW (ref 12.0–15.0)
Immature Granulocytes: 0 %
Lymphocytes Relative: 48 %
Lymphs Abs: 3.5 10*3/uL (ref 0.7–4.0)
MCH: 30.3 pg (ref 26.0–34.0)
MCHC: 32.2 g/dL (ref 30.0–36.0)
MCV: 94.1 fL (ref 80.0–100.0)
Monocytes Absolute: 0.5 10*3/uL (ref 0.1–1.0)
Monocytes Relative: 7 %
Neutro Abs: 3.1 10*3/uL (ref 1.7–7.7)
Neutrophils Relative %: 43 %
Platelets: 470 10*3/uL — ABNORMAL HIGH (ref 150–400)
RBC: 3.9 MIL/uL (ref 3.87–5.11)
RDW: 13.8 % (ref 11.5–15.5)
WBC: 7.2 10*3/uL (ref 4.0–10.5)
nRBC: 0 % (ref 0.0–0.2)

## 2021-10-21 LAB — COMPREHENSIVE METABOLIC PANEL
ALT: 15 U/L (ref 0–44)
AST: 16 U/L (ref 15–41)
Albumin: 3.7 g/dL (ref 3.5–5.0)
Alkaline Phosphatase: 67 U/L (ref 38–126)
Anion gap: 5 (ref 5–15)
BUN: 19 mg/dL (ref 6–20)
CO2: 23 mmol/L (ref 22–32)
Calcium: 8.4 mg/dL — ABNORMAL LOW (ref 8.9–10.3)
Chloride: 108 mmol/L (ref 98–111)
Creatinine, Ser: 0.78 mg/dL (ref 0.44–1.00)
GFR, Estimated: >60 mL/min (ref >60)
Glucose, Bld: 88 mg/dL (ref 70–99)
Potassium: 3.8 mmol/L (ref 3.5–5.1)
Sodium: 136 mmol/L (ref 135–145)
Total Bilirubin: 0.4 mg/dL (ref 0.3–1.2)
Total Protein: 7.2 g/dL (ref 6.5–8.1)

## 2021-10-21 NOTE — Progress Notes (Signed)
DUE TO COVID-19 ONLY ONE VISITOR IS ALLOWED TO COME WITH YOU AND STAY IN THE WAITING ROOM ONLY DURING PRE OP AND PROCEDURE DAY OF SURGERY.  2 VISITOR  MAY VISIT WITH YOU AFTER SURGERY IN YOUR PRIVATE ROOM DURING VISITING HOURS ONLY! ?YOU MAY HAVE ONE PERSON SPEND THE NITE WITH YOU IN YOUR ROOM AFTER SURGERY.   ? ?YOU NEED TO HAVE A COVID 19 TEST ON      10/31/2021.                            COME THRU MAIN ENTRANCE AT [facility] HAVE A SEAT IN THE LOBBY ON THE RIGHT AS YOU COME THRU THE DOOR.  CALL [phone] AND GIVE THEM YOUR NAME AND LET THEM KNOW YOU ARE HERE FOR COVID TESTING.  ? ? Your procedure is scheduled on:  ? ? Report to [facility] Main  Entrance ? ? Report to admitting at    0515            AM ?DO NOT BRING INSURANCE CARD, PICTURE ID OR WALLET DAY OF SURGERY.  ?  ? ? Call this number if you have problems the morning of surgery [phone]  ? ? REMEMBER: NO  SOLID FOODS , CANDY, GUM OR MINTS AFTER MIDNITE THE NITE BEFORE SURGERY .       Marland Kitchen CLEAR LIQUIDS UNTIL   0415am             DAY OF SURGERY.      PLEASE FINISH ENSURE DRINK PER SURGEON ORDER  WHICH NEEDS TO BE COMPLETED AT    0415 am      MORNING OF SURGERY.   ?  ? ? ? ?CLEAR LIQUID DIET ? ? ?Foods Allowed      ?WATER ?BLACK COFFEE ( SUGAR OK, NO MILK, CREAM OR CREAMER) REGULAR AND DECAF  ?TEA ( SUGAR OK NO MILK, CREAM, OR CREAMER) REGULAR AND DECAF  ?PLAIN JELLO ( NO RED)  ?FRUIT ICES ( NO RED, NO FRUIT PULP)  ?POPSICLES ( NO RED)  ?JUICE- APPLE, WHITE GRAPE AND WHITE CRANBERRY  ?SPORT DRINK LIKE GATORADE ( NO RED)  ?CLEAR BROTH ( VEGETABLE , CHICKEN OR BEEF)                                                               ? ?    ? ?BRUSH YOUR TEETH MORNING OF SURGERY AND RINSE YOUR MOUTH OUT, NO CHEWING GUM CANDY OR MINTS. ?  ? ? Take these medicines the morning of surgery with A SIP OF WATER:  none  ? ? ?DO NOT TAKE ANY DIABETIC MEDICATIONS DAY OF YOUR SURGERY ?                  ?            You may not have any metal on your body  including hair pins and  ?            piercings  Do not wear jewelry, make-up, lotions, powders or perfumes, deodorant ?            Do not wear nail polish on your fingernails.   ?  IF YOU ARE A FEMALE AND WANT TO SHAVE UNDER ARMS OR LEGS PRIOR TO SURGERY YOU MUST DO SO AT LEAST 48 HOURS PRIOR TO SURGERY.  ?            Men may shave face and neck. ? ? Do not bring valuables to the hospital. [facility] NOT ?            RESPONSIBLE   FOR VALUABLES. ? Contacts, dentures or bridgework may not be worn into surgery. ? Leave suitcase in the car. After surgery it may be brought to your room. ? ?  ? Patients discharged the day of surgery will not be allowed to drive home. IF YOU ARE HAVING SURGERY AND GOING HOME THE SAME DAY, YOU MUST HAVE AN ADULT TO DRIVE YOU HOME AND BE WITH YOU FOR 24 HOURS. YOU MAY GO HOME BY TAXI OR UBER OR ORTHERWISE, BUT AN ADULT MUST ACCOMPANY YOU HOME AND STAY WITH YOU FOR 24 HOURS. ?  ? ?            Please read over the following fact sheets you were given: ?_____________________________________________________________________ ? ?[facility] - Preparing for Surgery ?Before surgery, you can play an important role.  Because skin is not sterile, your skin needs to be as free of germs as possible.  You can reduce the number of germs on your skin by washing with CHG (chlorahexidine gluconate) soap before surgery.  CHG is an antiseptic cleaner which kills germs and bonds with the skin to continue killing germs even after washing. ?Please DO NOT use if you have an allergy to CHG or antibacterial soaps.  If your skin becomes reddened/irritated stop using the CHG and inform your nurse when you arrive at Short Stay. ?Do not shave (including legs and underarms) for at least 48 hours prior to the first CHG shower.  You may shave your face/neck. ?Please follow these instructions carefully: ? 1.  Shower with CHG Soap the night before surgery and the  morning of Surgery. ? 2.  If you choose to wash  your hair, wash your hair first as usual with your  normal  shampoo. ? 3.  After you shampoo, rinse your hair and body thoroughly to remove the  shampoo.                           4.  Use CHG as you would any other liquid soap.  You can apply chg directly  to the skin and wash  ?                     Gently with a scrungie or clean washcloth. ? 5.  Apply the CHG Soap to your body ONLY FROM THE NECK DOWN.   Do not use on face/ open      ?                     Wound or open sores. Avoid contact with eyes, ears mouth and genitals (private parts).  ?                     Production manager,  Genitals (private parts) with your normal soap. ?            6.  Wash thoroughly, paying special attention to the area where your surgery  will be performed. ? 7.  Thoroughly rinse your body with warm  water from the neck down. ? 8.  DO NOT shower/wash with your normal soap after using and rinsing off  the CHG Soap. ?               9.  Pat yourself dry with a clean towel. ?           10.  Wear clean pajamas. ?           11.  Place clean sheets on your bed the night of your first shower and do not  sleep with pets. ?Day of Surgery : ?Do not apply any lotions/deodorants the morning of surgery.  Please wear clean clothes to the hospital/surgery center. ? ?FAILURE TO FOLLOW THESE INSTRUCTIONS MAY RESULT IN THE CANCELLATION OF YOUR SURGERY ?PATIENT SIGNATURE_________________________________ ? ?NURSE SIGNATURE__________________________________ ? ?________________________________________________________________________  ? ? ?           ?

## 2021-10-31 ENCOUNTER — Encounter (HOSPITAL_COMMUNITY): Payer: MEDICAID

## 2021-11-03 NOTE — H&P (Signed)
PROVIDER: Katha Cabal, MD ? ?MRN: [id number] ?DOB: 01-Nov-1980 ?DATE OF ENCOUNTER: 10/01/2021 ?Subjective  ? ?Chief Complaint: Bariatric Follow-up ? ? ?History of Present Illness: ?Renee Bates is a 41 y.o. female who is seen today for question about revision of her gastric bypass. Her upper GI at [facility] showed an large pouch: ?Previous gastric bypass surgery. Enlarged gastric pouch,  ?significantly increased in size compared to original postop study in  ?2015. No other abnormalities identified.  ?Her insurance will not allow [place] to do a lap band over bypass. We will therefore need to look at some form of a pouch revision  or adjustment either in terms of plicating the pouch since there, resecting and redoing an anastomosis. I think this would need to be done with endoscopic assistance and we will go ahead and try to get her approved and set up for surgery. Initially she lost 80 lbs but then after her diet was liberalized by dietary she regained 60 lbs ?Review of Systems: ?See HPI as well for other ROS. ? ?ROS  ? ?Medical History: ?Past Medical History:  ?Diagnosis Date  ? Hypertension  ? Sleep apnea  ? ?Patient Active Problem List  ?Diagnosis  ? Bariatric surgery status  ? Primary hypertension  ? Morbid obesity with body mass index of 40.0-44.9 in adult (CMS-HCC)  ? OSA (obstructive sleep apnea)  ? Other disorder of menstruation and other abnormal bleeding from female genital tract  ? Weight loss counseling, encounter for  ? Excessive and frequent menstruation  ? Morbid (severe) obesity due to excess calories (CMS-HCC)  ? ?Past Surgical History:  ?Procedure Laterality Date  ? CESAREAN SECTION  ? LAPAROSCOPIC TUBAL LIGATION  ? ROUX-EN-Y PROCEDURE  ? ? ?No Known Allergies ? ?Current Outpatient Medications on File Prior to Visit  ?Medication Sig Dispense Refill  ? losartan (COZAAR) 50 MG tablet  ? megestroL (MEGACE) 40 MG tablet Take 40 mg by mouth once daily  ? ?No current facility-administered medications on  file prior to visit.  ? ?Family History  ?Problem Relation Age of Onset  ? High blood pressure (Hypertension) Mother  ? Diabetes Mother  ? High blood pressure (Hypertension) Father  ? Diabetes Father  ? High blood pressure (Hypertension) Sister  ? Asthma Son  ? ? ?Social History  ? ?Tobacco Use  ?Smoking Status Never  ?Smokeless Tobacco Never  ? ? ?Social History  ? ?Socioeconomic History  ? Marital status: Single  ?Tobacco Use  ? Smoking status: Never  ? Smokeless tobacco: Never  ?Vaping Use  ? Vaping Use: Never used  ?Substance and Sexual Activity  ? Alcohol use: Not Currently  ? Drug use: Never  ? ?Objective:  ? ?Vitals:  ?10/01/21 1425  ?Weight: (!) 106.6 kg (235 lb)  ?Height: 157.5 cm (5\' 2" )  ? ?Body mass index is 42.98 kg/m?. ? ?Physical Exam ?General: Obese African-American female no acute distress ?Labs, Imaging and Diagnostic Testing: ?HEENT unremarkable ?Chest clear ?Heart SR without murmurs or gallops ?Abdomen nontender and old scars healed.   ?Ext FROM ?Neuro alert and oriented x 3 ? ?Impression  ?Previous gastric bypass surgery. Enlarged gastric pouch,  ?significantly increased in size compared to original postop study in  ?2015. No other abnormalities identified.  ? ?Assessment and Plan:  ? ? ?Weight gain following gastric bypass surgery ? ? ? ?Plan endoscopy and pouch adjustment. ? ?Seen in preop and plan discussed.  ? ?Heinrich Fertig 2016, MD   ? ?

## 2021-11-03 NOTE — Anesthesia Preprocedure Evaluation (Addendum)
Anesthesia Evaluation  ?Patient identified by MRN, date of birth, ID band ?Patient awake ? ? ? ?Reviewed: ?Allergy & Precautions, NPO status , Patient's Chart, lab work & pertinent test results ? ?History of Anesthesia Complications ?Negative for: history of anesthetic complications ? ?Airway ?Mallampati: II ? ?TM Distance: >3 FB ?Neck ROM: Full ? ? ? Dental ? ?(+) Dental Advisory Given, Teeth Intact ?  ?Pulmonary ?sleep apnea (does not use CPAP) ,  ?  ?breath sounds clear to auscultation ? ? ? ? ? ? Cardiovascular ?hypertension, Pt. on medications ?(-) angina ?Rhythm:Regular Rate:Normal ? ? ?  ?Neuro/Psych ?negative neurological ROS ?   ? GI/Hepatic ?Neg liver ROS, neg GERD  ,S/p Roux-en-Y ?  ?Endo/Other  ?Morbid obesity ? Renal/GU ?negative Renal ROS  ? ?  ?Musculoskeletal ? ? Abdominal ?(+) + obese,   ?Peds ? Hematology ?negative hematology ROS ?(+)   ?Anesthesia Other Findings ? ? Reproductive/Obstetrics ? ?  ? ? ? ? ? ? ? ? ? ? ? ? ? ?  ?  ? ? ? ? ? ? ? ?Anesthesia Physical ?Anesthesia Plan ? ?ASA: 3 ? ?Anesthesia Plan: General  ? ?Post-op Pain Management: Tylenol PO (pre-op)*  ? ?Induction: Intravenous ? ?PONV Risk Score and Plan: 3 and Ondansetron, Dexamethasone and Scopolamine patch - Pre-op ? ?Airway Management Planned: Oral ETT ? ?Additional Equipment: None ? ?Intra-op Plan:  ? ?Post-operative Plan: Extubation in OR ? ?Informed Consent: I have reviewed the patients History and Physical, chart, labs and discussed the procedure including the risks, benefits and alternatives for the proposed anesthesia with the patient or authorized representative who has indicated his/her understanding and acceptance.  ? ? ? ?Dental advisory given ? ?Plan Discussed with: CRNA and Surgeon ? ?Anesthesia Plan Comments:   ? ? ? ? ? ?Anesthesia Quick Evaluation ? ?

## 2021-11-04 ENCOUNTER — Encounter (HOSPITAL_COMMUNITY): Payer: Self-pay | Admitting: Surgery

## 2021-11-04 ENCOUNTER — Inpatient Hospital Stay (HOSPITAL_COMMUNITY): Payer: 59 | Admitting: Physician Assistant

## 2021-11-04 ENCOUNTER — Encounter (HOSPITAL_COMMUNITY)
Admission: RE | Disposition: A | Discharge status: Home / Self Care | Payer: Self-pay | Source: Home / Self Care | Attending: Surgery

## 2021-11-04 ENCOUNTER — Inpatient Hospital Stay (HOSPITAL_COMMUNITY)
Admission: RE | Admit: 2021-11-04 | Discharge: 2021-11-05 | DRG: 621 | Disposition: A | Discharge status: Home / Self Care | Payer: 59 | Attending: Surgery | Admitting: Surgery

## 2021-11-04 ENCOUNTER — Other Ambulatory Visit: Payer: Self-pay

## 2021-11-04 ENCOUNTER — Inpatient Hospital Stay (HOSPITAL_COMMUNITY): Payer: 59 | Admitting: Certified Registered Nurse Anesthetist

## 2021-11-04 DIAGNOSIS — Z6841 Body Mass Index (BMI) 40.0 and over, adult: Secondary | ICD-10-CM

## 2021-11-04 DIAGNOSIS — G4733 Obstructive sleep apnea (adult) (pediatric): Secondary | ICD-10-CM | POA: Diagnosis present

## 2021-11-04 DIAGNOSIS — Z833 Family history of diabetes mellitus: Secondary | ICD-10-CM | POA: Diagnosis not present

## 2021-11-04 DIAGNOSIS — I10 Essential (primary) hypertension: Secondary | ICD-10-CM | POA: Diagnosis present

## 2021-11-04 DIAGNOSIS — Z01818 Encounter for other preprocedural examination: Secondary | ICD-10-CM

## 2021-11-04 DIAGNOSIS — G4730 Sleep apnea, unspecified: Secondary | ICD-10-CM

## 2021-11-04 DIAGNOSIS — Z8249 Family history of ischemic heart disease and other diseases of the circulatory system: Secondary | ICD-10-CM

## 2021-11-04 DIAGNOSIS — Z9884 Bariatric surgery status: Secondary | ICD-10-CM

## 2021-11-04 HISTORY — PX: LAPAROSCOPIC PARTIAL GASTRECTOMY: SHX5908

## 2021-11-04 HISTORY — PX: UPPER GI ENDOSCOPY: SHX6162

## 2021-11-04 LAB — CBC
HCT: 36.8 % (ref 36.0–46.0)
Hemoglobin: 11.7 g/dL — ABNORMAL LOW (ref 12.0–15.0)
MCH: 30.2 pg (ref 26.0–34.0)
MCHC: 31.8 g/dL (ref 30.0–36.0)
MCV: 94.8 fL (ref 80.0–100.0)
Platelets: 439 10*3/uL — ABNORMAL HIGH (ref 150–400)
RBC: 3.88 MIL/uL (ref 3.87–5.11)
RDW: 14.2 % (ref 11.5–15.5)
WBC: 14.2 10*3/uL — ABNORMAL HIGH (ref 4.0–10.5)
nRBC: 0 % (ref 0.0–0.2)

## 2021-11-04 LAB — HEMOGLOBIN AND HEMATOCRIT, BLOOD
HCT: 37.8 % (ref 36.0–46.0)
Hemoglobin: 12 g/dL (ref 12.0–15.0)

## 2021-11-04 LAB — TYPE AND SCREEN
ABO/RH(D): O POS
Antibody Screen: NEGATIVE

## 2021-11-04 LAB — CREATININE, SERUM
Creatinine, Ser: 0.66 mg/dL (ref 0.44–1.00)
GFR, Estimated: >60 mL/min (ref >60)

## 2021-11-04 LAB — PREGNANCY, URINE: Preg Test, Ur: NEGATIVE

## 2021-11-04 LAB — ABO/RH: ABO/RH(D): O POS

## 2021-11-04 SURGERY — LAPAROSCOPIC PARTIAL GASTRECTOMY
Anesthesia: General

## 2021-11-04 MED ORDER — KCL IN DEXTROSE-NACL 20-5-0.45 MEQ/L-%-% IV SOLN
INTRAVENOUS | Status: DC
  Filled 2021-11-04 (×2): qty 1000

## 2021-11-04 MED ORDER — SODIUM CHLORIDE 0.9 % IV SOLN
2.0000 g | INTRAVENOUS | Status: AC
  Administered 2021-11-04: 2 g via INTRAVENOUS
  Filled 2021-11-04: qty 2

## 2021-11-04 MED ORDER — OXYCODONE HCL 5 MG PO TABS: 5.0000 mg | ORAL_TABLET | Freq: Once | ORAL | Status: DC | PRN

## 2021-11-04 MED ORDER — LIDOCAINE HCL (PF) 2 % IJ SOLN
INTRAMUSCULAR | Status: AC
  Filled 2021-11-04: qty 20

## 2021-11-04 MED ORDER — ACETAMINOPHEN 500 MG PO TABS
1000.0000 mg | ORAL_TABLET | Freq: Three times a day (TID) | ORAL | Status: DC
  Administered 2021-11-04 – 2021-11-05 (×4): 1000 mg via ORAL
  Filled 2021-11-04 (×4): qty 2

## 2021-11-04 MED ORDER — EPHEDRINE 5 MG/ML INJ
INTRAVENOUS | Status: AC
  Filled 2021-11-04: qty 5

## 2021-11-04 MED ORDER — ACETAMINOPHEN 500 MG PO TABS
1000.0000 mg | ORAL_TABLET | ORAL | Status: AC
  Administered 2021-11-04: 1000 mg via ORAL
  Filled 2021-11-04: qty 2

## 2021-11-04 MED ORDER — LIDOCAINE 2% (20 MG/ML) 5 ML SYRINGE
INTRAMUSCULAR | Status: DC | PRN
Start: 2021-11-04 — End: 2021-11-04
  Administered 2021-11-04: 20 mg via INTRAVENOUS

## 2021-11-04 MED ORDER — SCOPOLAMINE 1 MG/3DAYS TD PT72: 1.0000 | MEDICATED_PATCH | TRANSDERMAL | Status: DC

## 2021-11-04 MED ORDER — BUPIVACAINE LIPOSOME 1.3 % IJ SUSP
INTRAMUSCULAR | Status: AC
  Filled 2021-11-04: qty 20

## 2021-11-04 MED ORDER — PHENYLEPHRINE 40 MCG/ML (10ML) SYRINGE FOR IV PUSH (FOR BLOOD PRESSURE SUPPORT)
PREFILLED_SYRINGE | INTRAVENOUS | Status: AC
  Filled 2021-11-04: qty 10

## 2021-11-04 MED ORDER — KETAMINE HCL 10 MG/ML IJ SOLN
INTRAMUSCULAR | Status: DC | PRN
Start: 2021-11-04 — End: 2021-11-04
  Administered 2021-11-04: 30 mg via INTRAVENOUS

## 2021-11-04 MED ORDER — PHENYLEPHRINE 40 MCG/ML (10ML) SYRINGE FOR IV PUSH (FOR BLOOD PRESSURE SUPPORT)
PREFILLED_SYRINGE | INTRAVENOUS | Status: DC | PRN
  Administered 2021-11-04 (×3): 80 ug via INTRAVENOUS

## 2021-11-04 MED ORDER — FENTANYL CITRATE (PF) 100 MCG/2ML IJ SOLN
INTRAMUSCULAR | Status: AC
  Filled 2021-11-04: qty 2

## 2021-11-04 MED ORDER — CHLORHEXIDINE GLUCONATE CLOTH 2 % EX PADS: 6.0000 | MEDICATED_PAD | Freq: Once | CUTANEOUS | Status: DC

## 2021-11-04 MED ORDER — ORAL CARE MOUTH RINSE: 15.0000 mL | Freq: Once | OROMUCOSAL | Status: AC

## 2021-11-04 MED ORDER — ENSURE MAX PROTEIN PO LIQD
2.0000 [oz_av] | ORAL | Status: DC
  Administered 2021-11-05 (×4): 2 [oz_av] via ORAL

## 2021-11-04 MED ORDER — FENTANYL CITRATE (PF) 100 MCG/2ML IJ SOLN
INTRAMUSCULAR | Status: DC | PRN
  Administered 2021-11-04 (×2): 50 ug via INTRAVENOUS
  Administered 2021-11-04: 25 ug via INTRAVENOUS
  Administered 2021-11-04: 50 ug via INTRAVENOUS
  Administered 2021-11-04: 25 ug via INTRAVENOUS
  Administered 2021-11-04: 50 ug via INTRAVENOUS

## 2021-11-04 MED ORDER — ROCURONIUM BROMIDE 10 MG/ML (PF) SYRINGE
PREFILLED_SYRINGE | INTRAVENOUS | Status: AC
  Filled 2021-11-04: qty 10

## 2021-11-04 MED ORDER — SUGAMMADEX SODIUM 500 MG/5ML IV SOLN
INTRAVENOUS | Status: AC
  Filled 2021-11-04: qty 5

## 2021-11-04 MED ORDER — LIDOCAINE HCL 2 % IJ SOLN
INTRAMUSCULAR | Status: AC
  Filled 2021-11-04: qty 20

## 2021-11-04 MED ORDER — SCOPOLAMINE 1 MG/3DAYS TD PT72
1.0000 | MEDICATED_PATCH | TRANSDERMAL | Status: DC
  Administered 2021-11-04: 1.5 mg via TRANSDERMAL
  Filled 2021-11-04: qty 1

## 2021-11-04 MED ORDER — APREPITANT 40 MG PO CAPS
40.0000 mg | ORAL_CAPSULE | ORAL | Status: AC
  Administered 2021-11-04: 40 mg via ORAL
  Filled 2021-11-04: qty 1

## 2021-11-04 MED ORDER — LACTATED RINGERS IV SOLN: INTRAVENOUS | Status: DC

## 2021-11-04 MED ORDER — ACETAMINOPHEN 160 MG/5ML PO SOLN: 1000.0000 mg | Freq: Three times a day (TID) | ORAL | Status: DC

## 2021-11-04 MED ORDER — HEPARIN SODIUM (PORCINE) 5000 UNIT/ML IJ SOLN
5000.0000 [IU] | INTRAMUSCULAR | Status: AC
  Administered 2021-11-04: 5000 [IU] via SUBCUTANEOUS
  Filled 2021-11-04: qty 1

## 2021-11-04 MED ORDER — ESMOLOL HCL 100 MG/10ML IV SOLN
INTRAVENOUS | Status: AC
  Filled 2021-11-04: qty 10

## 2021-11-04 MED ORDER — HYDROMORPHONE HCL 1 MG/ML IJ SOLN
INTRAMUSCULAR | Status: AC
  Filled 2021-11-04: qty 1

## 2021-11-04 MED ORDER — PROPOFOL 10 MG/ML IV BOLUS
INTRAVENOUS | Status: AC
  Filled 2021-11-04: qty 20

## 2021-11-04 MED ORDER — DEXAMETHASONE SODIUM PHOSPHATE 10 MG/ML IJ SOLN
INTRAMUSCULAR | Status: DC | PRN
  Administered 2021-11-04: 5 mg via INTRAVENOUS

## 2021-11-04 MED ORDER — ONDANSETRON HCL 4 MG/2ML IJ SOLN
INTRAMUSCULAR | Status: AC
  Filled 2021-11-04: qty 2

## 2021-11-04 MED ORDER — ROCURONIUM BROMIDE 10 MG/ML (PF) SYRINGE
PREFILLED_SYRINGE | INTRAVENOUS | Status: DC | PRN
  Administered 2021-11-04: 20 mg via INTRAVENOUS
  Administered 2021-11-04: 70 mg via INTRAVENOUS

## 2021-11-04 MED ORDER — CHLORHEXIDINE GLUCONATE 0.12 % MT SOLN
15.0000 mL | Freq: Once | OROMUCOSAL | Status: AC
  Administered 2021-11-04: 15 mL via OROMUCOSAL

## 2021-11-04 MED ORDER — ESMOLOL HCL 100 MG/10ML IV SOLN
INTRAVENOUS | Status: DC | PRN
  Administered 2021-11-04: 20 mg via INTRAVENOUS
  Administered 2021-11-04: 400 mg via INTRAVENOUS

## 2021-11-04 MED ORDER — LIDOCAINE HCL (PF) 2 % IJ SOLN
INTRAMUSCULAR | Status: AC
  Filled 2021-11-04: qty 5

## 2021-11-04 MED ORDER — ACETAMINOPHEN 500 MG PO TABS: 1000.0000 mg | ORAL_TABLET | Freq: Once | ORAL | Status: DC

## 2021-11-04 MED ORDER — LACTATED RINGERS IR SOLN
Status: DC | PRN
Start: 2021-11-04 — End: 2021-11-04
  Administered 2021-11-04: 1000 mL

## 2021-11-04 MED ORDER — MIDAZOLAM HCL 2 MG/2ML IJ SOLN: 0.5000 mg | Freq: Once | INTRAMUSCULAR | Status: DC | PRN

## 2021-11-04 MED ORDER — BUPIVACAINE LIPOSOME 1.3 % IJ SUSP: 20.0000 mL | Freq: Once | INTRAMUSCULAR | Status: DC

## 2021-11-04 MED ORDER — HYDROMORPHONE HCL 1 MG/ML IJ SOLN
0.2500 mg | INTRAMUSCULAR | Status: DC | PRN
  Administered 2021-11-04 (×2): 0.5 mg via INTRAVENOUS

## 2021-11-04 MED ORDER — ONDANSETRON HCL 4 MG/2ML IJ SOLN
INTRAMUSCULAR | Status: DC | PRN
  Administered 2021-11-04: 4 mg via INTRAVENOUS

## 2021-11-04 MED ORDER — MEPERIDINE HCL 50 MG/ML IJ SOLN: 6.2500 mg | INTRAMUSCULAR | Status: DC | PRN

## 2021-11-04 MED ORDER — DEXAMETHASONE SODIUM PHOSPHATE 10 MG/ML IJ SOLN
INTRAMUSCULAR | Status: AC
  Filled 2021-11-04: qty 1

## 2021-11-04 MED ORDER — PROPOFOL 10 MG/ML IV BOLUS
INTRAVENOUS | Status: DC | PRN
  Administered 2021-11-04: 200 mg via INTRAVENOUS

## 2021-11-04 MED ORDER — ONDANSETRON HCL 4 MG/2ML IJ SOLN: 4.0000 mg | INTRAMUSCULAR | Status: DC | PRN

## 2021-11-04 MED ORDER — OXYCODONE HCL 5 MG/5ML PO SOLN: 5.0000 mg | Freq: Once | ORAL | Status: DC | PRN

## 2021-11-04 MED ORDER — MORPHINE SULFATE (PF) 2 MG/ML IV SOLN: 1.0000 mg | INTRAVENOUS | Status: DC | PRN

## 2021-11-04 MED ORDER — 0.9 % SODIUM CHLORIDE (POUR BTL) OPTIME
TOPICAL | Status: DC | PRN
  Administered 2021-11-04: 1000 mL

## 2021-11-04 MED ORDER — MIDAZOLAM HCL 2 MG/2ML IJ SOLN
INTRAMUSCULAR | Status: AC
  Filled 2021-11-04: qty 2

## 2021-11-04 MED ORDER — MIDAZOLAM HCL 5 MG/5ML IJ SOLN
INTRAMUSCULAR | Status: DC | PRN
  Administered 2021-11-04: 2 mg via INTRAVENOUS

## 2021-11-04 MED ORDER — PROMETHAZINE HCL 25 MG/ML IJ SOLN: 6.2500 mg | INTRAMUSCULAR | Status: DC | PRN

## 2021-11-04 MED ORDER — SODIUM CHLORIDE (PF) 0.9 % IJ SOLN
INTRAMUSCULAR | Status: DC | PRN
  Administered 2021-11-04: 10 mL via INTRAVENOUS

## 2021-11-04 MED ORDER — LIDOCAINE HCL (PF) 2 % IJ SOLN
INTRAMUSCULAR | Status: DC | PRN
  Administered 2021-11-04: 1.5 mg/kg/h via INTRADERMAL

## 2021-11-04 MED ORDER — METOPROLOL TARTRATE 5 MG/5ML IV SOLN: 5.0000 mg | Freq: Four times a day (QID) | INTRAVENOUS | Status: DC | PRN

## 2021-11-04 MED ORDER — OXYCODONE HCL 5 MG/5ML PO SOLN
5.0000 mg | Freq: Four times a day (QID) | ORAL | Status: DC | PRN
  Administered 2021-11-04: 5 mg via ORAL
  Filled 2021-11-04: qty 5

## 2021-11-04 MED ORDER — KETAMINE HCL 50 MG/5ML IJ SOSY
PREFILLED_SYRINGE | INTRAMUSCULAR | Status: AC
  Filled 2021-11-04: qty 5

## 2021-11-04 MED ORDER — PANTOPRAZOLE SODIUM 40 MG IV SOLR
40.0000 mg | Freq: Every day | INTRAVENOUS | Status: DC
  Administered 2021-11-04: 40 mg via INTRAVENOUS
  Filled 2021-11-04: qty 10

## 2021-11-04 MED ORDER — HEPARIN SODIUM (PORCINE) 5000 UNIT/ML IJ SOLN
5000.0000 [IU] | Freq: Three times a day (TID) | INTRAMUSCULAR | Status: DC
  Administered 2021-11-04 – 2021-11-05 (×3): 5000 [IU] via SUBCUTANEOUS
  Filled 2021-11-04 (×3): qty 1

## 2021-11-04 MED ORDER — TISSEEL VH 10 ML EX KIT
PACK | CUTANEOUS | Status: AC
  Filled 2021-11-04: qty 1

## 2021-11-04 MED ORDER — BUPIVACAINE LIPOSOME 1.3 % IJ SUSP
INTRAMUSCULAR | Status: DC | PRN
  Administered 2021-11-04: 20 mL

## 2021-11-04 MED ORDER — SODIUM CHLORIDE (PF) 0.9 % IJ SOLN
INTRAMUSCULAR | Status: AC
  Filled 2021-11-04: qty 10

## 2021-11-04 SURGICAL SUPPLY — 76 items
APPLICATOR COTTON TIP 6 STRL (MISCELLANEOUS) ×2 IMPLANT
APPLICATOR COTTON TIP 6IN STRL (MISCELLANEOUS)
APPLIER CLIP ROT 10 11.4 M/L (STAPLE)
APPLIER CLIP ROT 13.4 12 LRG (CLIP)
BAG COUNTER SPONGE SURGICOUNT (BAG) IMPLANT
BAG LAPAROSCOPIC 12 15 PORT 16 (BASKET) IMPLANT
BAG RETRIEVAL 12/15 (BASKET) ×2
BLADE SURG 15 STRL LF DISP TIS (BLADE) ×1 IMPLANT
BLADE SURG 15 STRL SS (BLADE) ×1
CABLE HIGH FREQUENCY MONO STRZ (ELECTRODE) IMPLANT
CHLORAPREP W/TINT 26 (MISCELLANEOUS) ×2 IMPLANT
CLIP APPLIE ROT 10 11.4 M/L (STAPLE) IMPLANT
CLIP APPLIE ROT 13.4 12 LRG (CLIP) IMPLANT
CLIP SUT LAPRA TY ABSORB (SUTURE) ×1 IMPLANT
DEVICE SUT QUICK LOAD TK 5 (STAPLE) IMPLANT
DEVICE SUT TI-KNOT TK 5X26 (MISCELLANEOUS) IMPLANT
DEVICE SUTURE ENDOST 10MM (ENDOMECHANICALS) ×1 IMPLANT
DISSECTOR BLUNT TIP ENDO 5MM (MISCELLANEOUS) IMPLANT
DRAIN PENROSE 0.25X18 (DRAIN) ×2 IMPLANT
GAUZE 4X4 16PLY ~~LOC~~+RFID DBL (SPONGE) ×2 IMPLANT
GLOVE SURG ENC TEXT LTX SZ8 (GLOVE) ×2 IMPLANT
GOWN STRL REUS W/ TWL XL LVL3 (GOWN DISPOSABLE) ×2 IMPLANT
GOWN STRL REUS W/TWL XL LVL3 (GOWN DISPOSABLE) ×2
GRASPER SUT TROCAR 14GX15 (MISCELLANEOUS) ×1 IMPLANT
HANDLE STAPLE EGIA 4 XL (STAPLE) ×2 IMPLANT
IRRIG SUCT STRYKERFLOW 2 WTIP (MISCELLANEOUS)
IRRIGATION SUCT STRKRFLW 2 WTP (MISCELLANEOUS) ×1 IMPLANT
KIT BASIN OR (CUSTOM PROCEDURE TRAY) ×2 IMPLANT
KIT GASTRIC LAVAGE 34FR ADT (SET/KITS/TRAYS/PACK) ×1 IMPLANT
KIT TURNOVER KIT A (KITS) IMPLANT
MARKER SKIN DUAL TIP RULER LAB (MISCELLANEOUS) ×2 IMPLANT
MAT PREVALON FULL STRYKER (MISCELLANEOUS) ×2 IMPLANT
NDL SPNL 22GX3.5 QUINCKE BK (NEEDLE) ×1 IMPLANT
NEEDLE SPNL 22GX3.5 QUINCKE BK (NEEDLE) ×2 IMPLANT
PACK CARDIOVASCULAR III (CUSTOM PROCEDURE TRAY) ×2 IMPLANT
PENCIL SMOKE EVACUATOR (MISCELLANEOUS) IMPLANT
RELOAD EGIA 45 MED/THCK PURPLE (STAPLE) IMPLANT
RELOAD EGIA 45 TAN VASC (STAPLE) IMPLANT
RELOAD EGIA 60 MED/THCK PURPLE (STAPLE) IMPLANT
RELOAD EGIA 60 TAN VASC (STAPLE) IMPLANT
RELOAD ENDO STITCH 2.0 (ENDOMECHANICALS) ×9
RELOAD STAPLE 45 PURP MED/THCK (STAPLE) IMPLANT
RELOAD STAPLE 60 MED/THCK ART (STAPLE) IMPLANT
RELOAD SUT SNGL STCH ABSRB 2-0 (ENDOMECHANICALS) ×5 IMPLANT
RELOAD SUT SNGL STCH BLK 2-0 (ENDOMECHANICALS) ×4 IMPLANT
RELOAD TRI 45 ART MED THCK PUR (STAPLE) ×2 IMPLANT
RELOAD TRI 60 ART MED THCK PUR (STAPLE) ×2 IMPLANT
SCISSORS LAP 5X45 EPIX DISP (ENDOMECHANICALS) ×2 IMPLANT
SEALANT SURGICAL APPL DUAL CAN (MISCELLANEOUS) ×1 IMPLANT
SET TUBE SMOKE EVAC HIGH FLOW (TUBING) ×2 IMPLANT
SHEARS HARMONIC ACE PLUS 45CM (MISCELLANEOUS) ×2 IMPLANT
SLEEVE ADV FIXATION 12X100MM (TROCAR) ×2 IMPLANT
SLEEVE ADV FIXATION 5X100MM (TROCAR) ×2 IMPLANT
SOL ANTI FOG 6CC (MISCELLANEOUS) ×1 IMPLANT
SOLUTION ANTI FOG 6CC (MISCELLANEOUS) ×1
STAPLER VISISTAT 35W (STAPLE) IMPLANT
SUT MNCRL AB 4-0 PS2 18 (SUTURE) ×6 IMPLANT
SUT RELOAD ENDO STITCH 2 48X1 (ENDOMECHANICALS) ×5
SUT RELOAD ENDO STITCH 2.0 (ENDOMECHANICALS) ×4
SUT SURGIDAC NAB ES-9 0 48 120 (SUTURE) IMPLANT
SUT VIC AB 2-0 SH 27 (SUTURE) ×1
SUT VIC AB 2-0 SH 27X BRD (SUTURE) ×1 IMPLANT
SUT VICRYL 0 TIES 12 18 (SUTURE) ×1 IMPLANT
SUTURE RELOAD END STTCH 2 48X1 (ENDOMECHANICALS) ×5 IMPLANT
SUTURE RELOAD ENDO STITCH 2.0 (ENDOMECHANICALS) ×4 IMPLANT
SYR 10ML ECCENTRIC (SYRINGE) ×2 IMPLANT
SYR 20ML LL LF (SYRINGE) ×4 IMPLANT
SYR 50ML LL SCALE MARK (SYRINGE) ×2 IMPLANT
TOWEL OR 17X26 10 PK STRL BLUE (TOWEL DISPOSABLE) ×2 IMPLANT
TOWEL OR NON WOVEN STRL DISP B (DISPOSABLE) ×2 IMPLANT
TRAY FOLEY MTR SLVR 16FR STAT (SET/KITS/TRAYS/PACK) ×1 IMPLANT
TROCAR ADV FIXATION 12X100MM (TROCAR) ×2 IMPLANT
TROCAR ADV FIXATION 5X100MM (TROCAR) ×2 IMPLANT
TROCAR BLADELESS OPT 5 100 (ENDOMECHANICALS) ×1 IMPLANT
TROCAR XCEL 12X100 BLDLESS (ENDOMECHANICALS) ×1 IMPLANT
TUBING CONNECTING 10 (TUBING) ×4 IMPLANT

## 2021-11-04 NOTE — Transfer of Care (Signed)
Immediate Anesthesia Transfer of Care Note ? ?Patient: Renee Bates ? ?Procedure(s) Performed: GASTRIC BYPASS POUCH REVISION ?UPPER GI ENDOSCOPY ? ?Patient Location: PACU ? ?Anesthesia Type:General ? ?Level of Consciousness: sedated ? ?Airway & Oxygen Therapy: Patient Spontanous Breathing and Patient connected to face mask oxygen ? ?Post-op Assessment: Report given to RN and Post -op Vital signs reviewed and stable ? ?Post vital signs: Reviewed and stable ? ?Last Vitals:  ?Vitals Value Taken Time  ?BP 145/82 11/04/21 0939  ?Temp    ?Pulse 77 11/04/21 0941  ?Resp 20 11/04/21 0941  ?SpO2 100 % 11/04/21 0941  ?Vitals shown include unvalidated device data. ? ?Last Pain:  ?Vitals:  ? 11/04/21 0550  ?TempSrc:   ?PainSc: 0-No pain  ?   ? ?  ? ?Complications: No notable events documented. ?

## 2021-11-04 NOTE — Anesthesia Procedure Notes (Signed)
Date/Time: 11/04/2021 9:33 AM ?Performed by: Minerva Ends, CRNA ?Oxygen Delivery Method: Simple face mask ?Placement Confirmation: positive ETCO2 and breath sounds checked- equal and bilateral ?Dental Injury: Teeth and Oropharynx as per pre-operative assessment  ? ? ? ? ?

## 2021-11-04 NOTE — Progress Notes (Signed)
Unable to teach patient how to use her incentive spirometer due to patient being asleep.  ?

## 2021-11-04 NOTE — Anesthesia Procedure Notes (Signed)
Procedure Name: Intubation ?Date/Time: 11/04/2021 7:29 AM ?Performed by: West Pugh, CRNA ?Pre-anesthesia Checklist: Patient identified, Emergency Drugs available, Suction available, Patient being monitored and Timeout performed ?Patient Re-evaluated:Patient Re-evaluated prior to induction ?Oxygen Delivery Method: Circle system utilized ?Preoxygenation: Pre-oxygenation with 100% oxygen ?Induction Type: IV induction ?Ventilation: Mask ventilation without difficulty and Two handed mask ventilation required ?Laryngoscope Size: Mac and 3 ?Grade View: Grade II ?Tube type: Oral ?Tube size: 7.0 mm ?Number of attempts: 1 ?Airway Equipment and Method: Stylet ?Placement Confirmation: ETT inserted through vocal cords under direct vision, positive ETCO2, CO2 detector and breath sounds checked- equal and bilateral ?Secured at: 22 cm ?Tube secured with: Tape ?Dental Injury: Teeth and Oropharynx as per pre-operative assessment  ? ? ? ? ?

## 2021-11-04 NOTE — Anesthesia Postprocedure Evaluation (Signed)
Anesthesia Post Note ? ?Patient: Renee Bates ? ?Procedure(s) Performed: GASTRIC BYPASS POUCH REVISION ?UPPER GI ENDOSCOPY ? ?  ? ?Patient location during evaluation: PACU ?Anesthesia Type: General ?Level of consciousness: sedated, oriented and patient cooperative ?Pain management: pain level controlled ?Vital Signs Assessment: post-procedure vital signs reviewed and stable ?Respiratory status: spontaneous breathing, nonlabored ventilation and respiratory function stable ?Cardiovascular status: blood pressure returned to baseline and stable ?Postop Assessment: no apparent nausea or vomiting ?Anesthetic complications: no ? ? ?No notable events documented. ? ?Last Vitals:  ?Vitals:  ? 11/04/21 1146 11/04/21 1214  ?BP: 139/89 (!) 148/87  ?Pulse: 77 81  ?Resp: 16 18  ?Temp:  37.1 ?C  ?SpO2: 98% 100%  ?  ?Last Pain:  ?Vitals:  ? 11/04/21 1214  ?TempSrc: Oral  ?PainSc:   ? ? ?  ?  ?  ?  ?  ?  ? ?Myranda Pavone,E. Hubert Derstine ? ? ? ? ?

## 2021-11-04 NOTE — Op Note (Signed)
NEVIA HENKIN  [birth year] ? ? ?11/04/2021 ? ? ? ?PCP:  Lazaro Arms, MD ? ? ?Surgeon: Wenda Low, MD, FACS ? ?Asst:  Phylliss Blakes, MD, FACS ? ?Anes:  general ? ?Preop Dx: Weight regain after gastric bypass ?Postop Dx: Same, post laparoscopy, upper endoscopy, resection of candy cane and resection of lateral portion of gastric pouch ? ?Procedure: Laparoscopy, upper endoscopy by Dr. 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 ?EBL:   30 cc ? ?Drains: none ? ?Description of Procedure: ? The patient was taken to OR 2 .  After anesthesia was administered and the patient was prepped  with chloroprep  and a timeout was performed.  Access to the abdomen was achieved with a 5 mm Optiview through the left upper quadrant.  Four 5 mm trocars and one 11 were placed in addition to the Neotsu in the upper abdomen.  Adhesions were taken down in the left upper quadrant.   ? ?The pouch was of appropriate length but had dilated a bit.  The adhesions along the left side were taken down.  The gastrojejunostomy was healthy appearing and there was a candy cane.  The candy cane was resected by dividing the bowel just distal to the anastomosis with Covidien TRS purple after an ewald tube was in the efferent limb.  The mesentery was divided near the candy cane with the Harmonic scalpel.   ? ?A portion of the left lateral stomach was seen as redundant and was removed with the Covidien purple load with TRS using two firings (6 cm and 4.5 cm).  These two specimen were removed in a pouch.  Blocks with Exparel bilaterally and around [facility] retractor.  11 mm port was closed with 0 vicryl PMI closure device.  Incisions closed with 4-0 monocryl and Dermabond.   ? ?The patient tolerated the procedure well and was taken to the PACU in stable condition.   ? ? ?Matt B. Daphine Deutscher, MD, FACS ?[facility], [place] ?[phone]  ?

## 2021-11-04 NOTE — Op Note (Signed)
Preoperative diagnosis: Status post revision of gastric bypass with wedge resection of gastric pouch and resection of Roux limb blind-end ? ?Postoperative diagnosis: Same  ? ?Procedure: Upper endoscopy  ? ?Surgeon: Berna Bue, M.D. ? ?Anesthesia: Gen.  ? ?Description of procedure: The endoscope was placed in the mouth and oropharynx and under endoscopic vision it was advanced to the esophagogastric junction which was identified at 37cm from the teeth.  The pouch was tensely insufflated while the upper abdomen was flooded with irrigation to perform a leak test, which was negative. No bubbles were seen.  The staple line was hemostatic.  The gastric pouch was noted to be evenly tubular without any retained fundus and measured 5 cm in length from the GE junction to the gastrojejunostomy.  The gastrojejunal anastomosis is widely patent without any ulceration or abnormality.  The blind end of the Roux limb is well shortened to less than 2 cm while maximally distended.  The lumen was decompressed and the scope was withdrawn without difficulty.   ? ?Berna Bue, M.D. ?General, Bariatric, & Minimally Invasive Surgery ?[facility] ? ? ?

## 2021-11-04 NOTE — Interval H&P Note (Signed)
History and Physical Interval Note: ? ?11/04/2021 ?7:04 AM ? ?VERENA SHAWGO  has presented today for surgery, with the diagnosis of morbid obesity.  The various methods of treatment have been discussed with the patient and family. After consideration of risks, benefits and other options for treatment, the patient has consented to  Procedure(s): ?GASTRIC BYPASS POUCH REVISION (N/A) ?UPPER GI ENDOSCOPY (N/A) as a surgical intervention.  The patient's history has been reviewed, patient examined, no change in status, stable for surgery.  I have reviewed the patient's chart and labs.  Questions were answered to the patient's satisfaction.   ? ? ?Valarie Merino ? ? ?

## 2021-11-05 ENCOUNTER — Encounter (HOSPITAL_COMMUNITY): Payer: Self-pay | Admitting: Surgery

## 2021-11-05 LAB — CBC WITH DIFFERENTIAL/PLATELET
Abs Immature Granulocytes: 0.02 10*3/uL (ref 0.00–0.07)
Basophils Absolute: 0 10*3/uL (ref 0.0–0.1)
Basophils Relative: 0 %
Eosinophils Absolute: 0 10*3/uL (ref 0.0–0.5)
Eosinophils Relative: 0 %
HCT: 33 % — ABNORMAL LOW (ref 36.0–46.0)
Hemoglobin: 10.6 g/dL — ABNORMAL LOW (ref 12.0–15.0)
Immature Granulocytes: 0 %
Lymphocytes Relative: 28 %
Lymphs Abs: 2.8 10*3/uL (ref 0.7–4.0)
MCH: 30.1 pg (ref 26.0–34.0)
MCHC: 32.1 g/dL (ref 30.0–36.0)
MCV: 93.8 fL (ref 80.0–100.0)
Monocytes Absolute: 0.6 10*3/uL (ref 0.1–1.0)
Monocytes Relative: 6 %
Neutro Abs: 6.7 10*3/uL (ref 1.7–7.7)
Neutrophils Relative %: 66 %
Platelets: 434 10*3/uL — ABNORMAL HIGH (ref 150–400)
RBC: 3.52 MIL/uL — ABNORMAL LOW (ref 3.87–5.11)
RDW: 14.1 % (ref 11.5–15.5)
WBC: 10.2 10*3/uL (ref 4.0–10.5)
nRBC: 0 % (ref 0.0–0.2)

## 2021-11-05 MED ORDER — OXYCODONE HCL 5 MG PO TABS: 5.0000 mg | ORAL_TABLET | Freq: Four times a day (QID) | ORAL | 0 refills | Status: AC | PRN

## 2021-11-05 MED ORDER — ONDANSETRON 4 MG PO TBDP: 4.0000 mg | ORAL_TABLET | Freq: Four times a day (QID) | ORAL | 0 refills | Status: AC | PRN

## 2021-11-05 MED ORDER — PANTOPRAZOLE SODIUM 40 MG PO TBEC: 40.0000 mg | DELAYED_RELEASE_TABLET | Freq: Every day | ORAL | 0 refills | Status: AC

## 2021-11-05 NOTE — Discharge Summary (Signed)
Physician Discharge Summary  ?Patient ID: ?Renee Bates ?MRN: [id number] ?DOB/AGE: 41-Feb-1982 40 y.o. ? ?PCP: Lazaro Arms, MD ? ?Admit date: 11/04/2021 ?Discharge date: 11/05/2021 ? ?Admission Diagnoses:  weight regain after gastric bypass ? ?Discharge Diagnoses:  same  ?Principal Problem: ?  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  S/P gastric bypass ? ? ?Surgery:  laparoscopic revision of gastric pouch ? ?Discharged Condition: improved ? ?Hospital Course:   Surgery performed on Tuesday.  She was begun on liquids and advanced to fufl liquids before discharge on Wednesday.   ? ?Consults: none ? ?Significant Diagnostic Studies: none ? ? ? ?Discharge Exam: ?Blood pressure (!) 141/88, pulse 74, temperature 98.8 ?F (37.1 ?C), temperature source Oral, resp. rate 20, height 5\' 2"  (1.575 m), weight 106.6 kg, SpO2 100 %. ?Incisions OK ? ?Disposition: Discharge disposition: 01-Home or Self Care ? ? ? ? ? ? ?Discharge Instructions   ? ? Ambulate hourly while awake   Complete by: As directed ?  ? Call MD for:  difficulty breathing, headache or visual disturbances   Complete by: As directed ?  ? Call MD for:  persistant dizziness or light-headedness   Complete by: As directed ?  ? Call MD for:  persistant nausea and vomiting   Complete by: As directed ?  ? Call MD for:  redness, tenderness, or signs of infection (pain, swelling, redness, odor or green/yellow discharge around incision site)   Complete by: As directed ?  ? Call MD for:  severe uncontrolled pain   Complete by: As directed ?  ? Call MD for:  temperature >101 F   Complete by: As directed ?  ? Diet bariatric full liquid   Complete by: As directed ?  ? Incentive spirometry   Complete by: As directed ?  ? Perform hourly while awake  ? ?  ? ?Allergies as of 11/05/2021   ?No Known Allergies ?  ? ?  ?Medication List  ?  ? ?STOP taking these medications   ? ?megestrol 40 MG tablet ?Commonly known as: MEGACE ?  ?phentermine 37.5 MG tablet ?Commonly known as:  ADIPEX-P ?  ? ?  ? ?TAKE these medications   ? ?losartan 50 MG tablet ?Commonly known as: COZAAR ?TAKE 1 TABLET(50 MG) BY MOUTH DAILY ?  ?multivitamin with minerals Tabs tablet ?Take 1 tablet by mouth daily. ?  ?ondansetron 4 MG disintegrating tablet ?Commonly known as: ZOFRAN-ODT ?Take 1 tablet (4 mg total) by mouth every 6 (six) hours as needed for nausea or vomiting. ?  ?oxyCODONE 5 MG immediate release tablet ?Commonly known as: Oxy IR/ROXICODONE ?Take 1 tablet (5 mg total) by mouth every 6 (six) hours as needed for severe pain. ?  ?pantoprazole 40 MG tablet ?Commonly known as: PROTONIX ?Take 1 tablet (40 mg total) by mouth daily. ?  ? ?  ? ? Follow-up Information   ? ? 11/07/2021, MD Follow up in 3 week(s).   ?Specialty: General Surgery ?Why: For routine follow up with Dr. Luretha Murphy ?Contact information: ?[address] ?[address] ?[place] ?[phone] ? ? ?  ?  ? ?  ?  ? ?  ? ? ?Signed: ?[phone] ?11/05/2021, 1:21 PM ?  ? ?

## 2021-11-05 NOTE — Progress Notes (Signed)
24hr fluid recall: 750mL. Per dehydration protocol, will call pt to f/u within one week post op. 

## 2021-11-05 NOTE — Progress Notes (Signed)
Transition of Care (TOC) Screening Note ? ?Patient Details  ?Name: Renee Bates ?Date of Birth: 1981-06-19 ? ?Transition of Care (TOC) CM/SW Contact:    ?Ewing Schlein, LCSW ?Phone Number: ?11/05/2021, 10:48 AM ? ?Transition of Care Department [facility]) has reviewed patient and no TOC needs have been identified at this time. We will continue to monitor patient advancement through interdisciplinary progression rounds. If new patient transition needs arise, please place a TOC consult. ?

## 2021-11-05 NOTE — Progress Notes (Signed)
Discharge instructions given to patient and all questions were answered.  

## 2021-11-05 NOTE — Progress Notes (Signed)
Patient alert and oriented, pain is controlled. Patient is tolerating fluids, advanced to protein shake today, patient is tolerating well. Reviewed Gastric Bypass discharge instructions with patient and patient is able to articulate understanding. Provided information on BELT program, Support Group and [facility] outpatient pharmacy. All questions answered, will continue to monitor.    

## 2021-11-07 ENCOUNTER — Telehealth (HOSPITAL_COMMUNITY): Payer: Self-pay | Admitting: *Deleted

## 2021-11-07 NOTE — Telephone Encounter (Signed)
1.  Tell me about your pain and pain management? ?Pt c/o abdominal incisional pain with movement and exertion.  Discussed with patient to try and splint her abdomen with changing positions to assist with the discomfort.  Encouraged pt to try options and/or contact [facility] if still concerned.  ? ?2.  Let's talk about fluid intake.  How much total fluid are you taking in? ?Pt states that she is working to meet goal of 64 oz of fluid today.  Pt has been able to consume approx. 36-40oz of fluid per day since surgery.  Pt plans to increase clear liquids to meet fluid goals. Pt instructed to assess status and suggestions daily utilizing Hydration Action Plan on discharge folder and to call [facility] if in the "red zone".  ? ?3.  How much protein have you taken in the last 2 days? ?Pt states that she is working to meet goal of goal of 60g of protein today.  Pt has already consumed one protein shake.  Pt plans to drink remainder of protein throughout the rest of the day to meet goal. ? ?4.  Have you had nausea?  Tell me about when have experienced nausea and what you did to help? ?Pt denies nausea. ?  ?5.  Has the frequency or color changed with your urine? ?Pt states that she is urinating "fine" with no changes in frequency or urgency.   ?  ?6.  Tell me what your incisions look like? ?"Incisions look fine". Pt denies a fever, chills.  Pt states incisions are not swollen, open, or draining.  Pt encouraged to call [facility] if incisions change. ?  ?7.  Have you been passing gas? BM? ?Pt states that she is having BMs. Last BM 11/07/21.   ?  ?8.  If a problem or question were to arise who would you call?  Do you know contact numbers for [facility], [facility], and [facility]? ?Pt denies dehydration symptoms.  Pt can describe s/sx of dehydration.  Pt knows to call [facility] for surgical, [facility] for nutrition, and [facility] for non-urgent questions or concerns. ?  ?9.  How has the walking going? ?Pt states she is walking around and able to be active without difficulty. ?  ?10.  Are you still using your incentive spirometer?  If so, how often? ?Pt states that she is using the I.S. "a few times a day". Pt encouraged to use incentive spirometer, at least 10x every hour while awake until she sees the surgeon. ? ?11.  How are your vitamins and calcium going?  How are you taking them? ?Pt states that she is taking her supplements and vitamins without difficulty. ? ? ?Reminded patient that the first 30 days post-operatively are important for successful recovery.  Practice good hand hygiene, wearing a mask when appropriate (since optional in most places), and minimizing exposure to people who live outside of the home, especially if they are exhibiting any respiratory, GI, or illness-like symptoms.   ?

## 2021-11-18 ENCOUNTER — Encounter: Payer: 59 | Attending: Surgery | Admitting: Skilled Nursing Facility1

## 2021-11-18 DIAGNOSIS — Z6841 Body Mass Index (BMI) 40.0 and over, adult: Secondary | ICD-10-CM | POA: Diagnosis not present

## 2021-11-18 DIAGNOSIS — Z9884 Bariatric surgery status: Secondary | ICD-10-CM | POA: Insufficient documentation

## 2021-11-18 NOTE — Progress Notes (Signed)
2 Week Post-Operative Nutrition Class ?  ?Patient was seen on 11/18/2021 for Post-Operative Nutrition education at the [facility].  ?  ?Surgery date: 11/04/2021 ?Surgery type: Gastric Bypass Pouch Revision ?Start weight at [facility]: 239 ?Weight today: 219.5 ?Bowel Habits: Every day to every other day no complaints ?  ?Body Composition Scale Date  ?Current Body Weight 219.5  ?Total Body Fat % 42.9  ?Visceral Fat 13  ?Fat-Free Mass % 57.0  ? Total Body Water % 43.0  ?Muscle-Mass lbs 30.4  ?BMI 40.0  ?Body Fat Displacement   ?       Torso  lbs 58.3  ?       Left Leg  lbs 11.6  ?       Right Leg  lbs 11.6  ?       Left Arm  lbs 5.8  ?       Right Arm   lbs 5.8  ? ? ?  ?The following the learning objectives were met by the patient during this course: ?Identifies Phase 3 (Soft, High Proteins) Dietary Goals and will begin from 2 weeks post-operatively to 2 months post-operatively ?Identifies appropriate sources of fluids and proteins  ?Identifies appropriate fat sources and healthy verses unhealthy fat types   ?States protein recommendations and appropriate sources post-operatively ?Identifies the need for appropriate texture modifications, mastication, and bite sizes when consuming solids ?Identifies appropriate fat consumption and sources ?Identifies appropriate multivitamin and calcium sources post-operatively ?Describes the need for physical activity post-operatively and will follow MD recommendations ?States when to call healthcare provider regarding medication questions or post-operative complications ?  ?Handouts given during class include: ?Phase 3A: Soft, High Protein Diet Handout ?Phase 3 High Protein Meals ?Healthy Fats ?  ?Follow-Up Plan: ?Patient will follow-up at [facility] in 6 weeks for 2 month post-op nutrition visit for diet advancement per MD.  ?

## 2021-11-24 ENCOUNTER — Telehealth: Payer: Self-pay | Admitting: Skilled Nursing Facility1

## 2021-11-24 NOTE — Telephone Encounter (Signed)
RD called pt to verify fluid intake once starting soft, solid proteins 2 week post-bariatric surgery.  ? ?Daily Fluid intake:  64+ ?Daily Protein intake:  60 ?Bowel Habits: every day ? ?Concerns/issues:  no issues ?

## 2021-12-30 ENCOUNTER — Telehealth: Payer: Self-pay

## 2021-12-30 ENCOUNTER — Other Ambulatory Visit: Payer: Self-pay

## 2021-12-30 ENCOUNTER — Other Ambulatory Visit: Payer: Self-pay | Admitting: Adult Health

## 2021-12-30 NOTE — Telephone Encounter (Signed)
PT CALLED AND STATED THAT SHE NEEDS A REFILL ON HER MEGESTROL 40g CALLED IN TO [facility] ON [address] ?

## 2021-12-31 ENCOUNTER — Encounter: Payer: 59 | Attending: Surgery | Admitting: Skilled Nursing Facility1

## 2021-12-31 ENCOUNTER — Encounter: Payer: Self-pay | Admitting: Skilled Nursing Facility1

## 2021-12-31 DIAGNOSIS — E669 Obesity, unspecified: Secondary | ICD-10-CM | POA: Diagnosis present

## 2021-12-31 MED ORDER — MEGESTROL ACETATE 40 MG PO TABS: 40.0000 mg | ORAL_TABLET | Freq: Every day | ORAL | 3 refills | Status: DC

## 2021-12-31 NOTE — Addendum Note (Signed)
Addended by: Cyril Mourning A on: 12/31/2021 08:30 AM ? ? Modules accepted: Orders ? ?

## 2021-12-31 NOTE — Progress Notes (Signed)
Bariatric Nutrition Follow-Up Visit Medical Nutrition Therapy    NUTRITION ASSESSMENT   Surgery date: 11/04/2021 Surgery type: Gastric Bypass Pouch Revision Start weight at [facility]: 239 Weight today: 215.6 lbs   Body Composition Scale 11/18/2021 12/31/2021  Current Body Weight 219.5 215.6  Total Body Fat % 42.9 42.4  Visceral Fat 13 13  Fat-Free Mass % 57.0 57.5   Total Body Water % 43.0 43.2  Muscle-Mass lbs 30.4 30.4  BMI 40.0 39.3  Body Fat Displacement           Torso  lbs 58.3 56.7         Left Leg  lbs 11.6 11.3         Right Leg  lbs 11.6 11.3         Left Arm  lbs 5.8 5.6         Right Arm   lbs 5.8 5.6   Clinical   Medical hx: HTN Medications: losartan, Labs:  Notable signs/symptoms: N/A Any previous deficiencies? No   Lifestyle & Dietary Hx  Pt states she works two jobs and on her feet all day.  Pt states she has been constipated this week.   Dietitian educated pt on the importance of getting enough fluid, and educated the pt on the introduction of non-starchy vegetables.   Estimated daily fluid intake: 32+ oz Estimated daily protein intake: 60 g Supplements: multi vitamin calcium Current average weekly physical activity: walking the dog twice a week, and elliptical at home  24-Hr Dietary Recall First Meal: sausage patty Snack: protein shake  Second Meal: hamberger patty with cheese with onions Snack: cashew and cheese  Third Meal: chicken wings (3) in air fryer Snack: cashews Beverages: water, water w/ flavorings  Post-Op Goals/ Signs/ Symptoms Using straws: no Drinking while eating: no Chewing/swallowing difficulties: no Changes in vision: no Changes to mood/headaches: no Hair loss/changes to skin/nails: no Difficulty focusing/concentrating: no Sweating: no Limb weakness: no Dizziness/lightheadedness: no Palpitations: no  Carbonated/caffeinated beverages: no N/V/D/C/Gas: no Abdominal pain: no Dumping syndrome: no    NUTRITION DIAGNOSIS   Overweight/obesity (Remy-3.3) related to past poor dietary habits and physical inactivity as evidenced by completed bariatric surgery and following dietary guidelines for continued weight loss and healthy nutrition status.     NUTRITION INTERVENTION Nutrition counseling (C-1) and education (E-2) to facilitate bariatric surgery goals, including: Diet advancement to the next phase (phase 4) now including non-starchy vegetables The importance of consuming adequate calories as well as certain nutrients daily due to the body's need for essential vitamins, minerals, and fats The importance of daily physical activity and to reach a goal of at least 150 minutes of moderate to vigorous physical activity weekly (or as directed by their physician) due to benefits such as increased musculature and improved lab values The importance of intuitive eating specifically learning hunger-satiety cues and understanding the importance of learning a new body: The importance of mindful eating to avoid grazing behaviors   -Continue to aim for a minimum of 64 fluid ounces 7 days a week with at least 30 ounces being plain water  -Eat non-starchy vegetables 2 times a day 7 days a week  -Start out with soft cooked vegetables today and tomorrow; if tolerated begin to eat raw vegetables or cooked including salads  -Eat your 3 ounces of protein first then start in on your non-starchy vegetables; once you understand how much of your meal leads to satisfaction and not full while still eating 3 ounces of protein and  non-starchy vegetables you can eat them in any order   -Continue to aim for 30 minutes of activity at least 5 times a week  -Do NOT cook with/add to your food: alfredo sauce, cheese sauce, barbeque sauce, ketchup, fat back, butter, bacon grease, grease, Crisco, OR SUGAR   Purpose of hydration: Water makes up over 50% of your total body water, and is part of many organs throughout the body. Water is essential to  transport digested nutrients, regulate body temperature, rid the body of waste products, and protects joints and the spinal cord. When not properly hydrated you will begin to experience headaches, cramps and dizziness. Further dehydration can result in rapid heart rate, shock, oliguria, and may cause seizures.  https://www.merckmanuals.com/home/hormonal-and-metabolic-disordehttps://www.usgs.gov/special-topic/water-science-school/science/water-you-water-and-human-body?qt-science_center_objects=0#qt-science_center_objectsrs/water-balance/about-body-water FriendLock.it InvestmentInstructor.com.cy FurEliminator.es https://www.health.BasicFM.no   Handouts Provided Include  Phase 4 Food Plan  Learning Style & Readiness for Change Teaching method utilized: Visual & Auditory  Demonstrated degree of understanding via: Teach Back  Readiness Level: Ready Barriers to learning/adherence to lifestyle change: non identified  RD's Notes for Next Visit Assess adherence to pt chosen goals   MONITORING & EVALUATION Dietary intake, weekly physical activity, body weight.  Next Steps Patient is to follow-up in August for 6 month post-op class.

## 2022-02-28 ENCOUNTER — Other Ambulatory Visit: Payer: Self-pay | Admitting: Adult Health

## 2022-03-24 ENCOUNTER — Ambulatory Visit: Payer: 59

## 2022-04-29 ENCOUNTER — Other Ambulatory Visit: Payer: Self-pay | Admitting: Adult Health

## 2022-07-01 ENCOUNTER — Ambulatory Visit (INDEPENDENT_AMBULATORY_CARE_PROVIDER_SITE_OTHER): Payer: 59 | Admitting: Adult Health

## 2022-07-01 ENCOUNTER — Encounter: Payer: Self-pay | Admitting: Adult Health

## 2022-07-01 VITALS — BP 149/99 | HR 85 | Ht 62.0 in | Wt 204.0 lb

## 2022-07-01 DIAGNOSIS — M545 Low back pain, unspecified: Secondary | ICD-10-CM | POA: Diagnosis not present

## 2022-07-01 LAB — POCT URINALYSIS DIPSTICK OB
Glucose, UA: NEGATIVE
Ketones, UA: NEGATIVE
Leukocytes, UA: NEGATIVE
Nitrite, UA: NEGATIVE
POC,PROTEIN,UA: NEGATIVE

## 2022-07-01 MED ORDER — CYCLOBENZAPRINE HCL 10 MG PO TABS: 10.0000 mg | ORAL_TABLET | Freq: Three times a day (TID) | ORAL | 0 refills | Status: DC | PRN

## 2022-07-01 NOTE — Patient Instructions (Signed)
Use heat and ice

## 2022-07-01 NOTE — Progress Notes (Signed)
  Subjective:     Patient ID: Renee Bates, female   DOB: [birth year], 41 y.o.   MRN: [id number]  HPI Renee Bates is a 41 year old black female, married, G2P2 in complaining of lower left back pain for about 3.5 weeks, and may go down left leg at times. Last pap was negative HPV and malignancy 09/26/19  Review of Systems Has low back pian on left for about 3.5 weeks,may go down left leg at times  No known injury No problems with urination or BM Reviewed past medical,surgical, social and family history. Reviewed medications and allergies.     Objective:   Physical Exam BP (!) 149/99 (BP Location: Left Arm, Patient Position: Sitting, Cuff Size: Normal)   Pulse 85   Ht 5\' 2"  (1.575 m)   Wt 204 lb (92.5 kg)   BMI 37.31 kg/m  urine dipstick small amount of bleed. Skin warm and dry. Lungs: clear to ausculation bilaterally. Cardiovascular: regular rate and rhythm. NO CVAT, has some point tenderness left lower back,muscles feel tight. Fall risk is low    07/01/2022    4:18 PM 09/26/2019    3:20 PM 05/03/2017    3:48 PM  Depression screen PHQ 2/9  Decreased Interest 0 0 0  Down, Depressed, Hopeless 0 0 0  PHQ - 2 Score 0 0 0        [facility] - 07/01/22 1618       Pregnancy Intention Screening   Does the patient want to become pregnant in the next year? N/A    Does the patient's partner want to become pregnant in the next year? N/A    Would the patient like to discuss contraceptive options today? No      Contraception Wrap Up   Current Method Female Sterilization    End Method Female Sterilization    Contraception Counseling Provided No             Assessment:     1. Acute left-sided low back pain, unspecified whether sciatica present Has point tenderness and muscles feel tight Will rx flexeril Use heat and ice Review handout  Meds ordered this encounter  Medications   cyclobenzaprine (FLEXERIL) 10 MG tablet    Sig: Take 1 tablet (10 mg total) by mouth every 8 (eight) hours  as needed for muscle spasms.    Dispense:  30 tablet    Refill:  0    Order Specific Question:   Supervising Provider    Answer:   07/03/22 [[id number]]       Plan:     Follow up in 1 week

## 2022-07-08 ENCOUNTER — Ambulatory Visit: Payer: Self-pay | Admitting: Adult Health

## 2022-07-08 DIAGNOSIS — Z539 Procedure and treatment not carried out, unspecified reason: Secondary | ICD-10-CM

## 2022-08-15 ENCOUNTER — Other Ambulatory Visit: Payer: Self-pay | Admitting: Adult Health

## 2022-09-14 ENCOUNTER — Other Ambulatory Visit: Payer: Self-pay | Admitting: Adult Health

## 2023-01-12 ENCOUNTER — Other Ambulatory Visit: Payer: Self-pay | Admitting: Adult Health

## 2023-04-20 ENCOUNTER — Other Ambulatory Visit: Payer: Self-pay | Admitting: Adult Health

## 2023-04-20 MED ORDER — CYCLOBENZAPRINE HCL 10 MG PO TABS
10.0000 mg | ORAL_TABLET | Freq: Three times a day (TID) | ORAL | 0 refills | Status: AC | PRN
Start: 2023-04-20 — End: ?

## 2023-04-20 NOTE — Progress Notes (Signed)
Refilled flexeril.

## 2023-06-16 ENCOUNTER — Other Ambulatory Visit: Payer: Self-pay | Admitting: Adult Health

## 2023-07-16 ENCOUNTER — Other Ambulatory Visit: Payer: Self-pay | Admitting: Adult Health

## 2023-09-08 ENCOUNTER — Encounter: Payer: Self-pay | Admitting: Adult Health

## 2023-09-08 ENCOUNTER — Other Ambulatory Visit (HOSPITAL_COMMUNITY)
Admission: RE | Admit: 2023-09-08 | Discharge: 2023-09-08 | Disposition: A | Discharge status: Home / Self Care | Payer: 59 | Source: Ambulatory Visit | Attending: Adult Health | Admitting: Adult Health

## 2023-09-08 ENCOUNTER — Ambulatory Visit: Payer: 59 | Admitting: Adult Health

## 2023-09-08 VITALS — BP 166/111 | HR 102 | Ht 62.0 in | Wt 203.0 lb

## 2023-09-08 DIAGNOSIS — Z7689 Persons encountering health services in other specified circumstances: Secondary | ICD-10-CM

## 2023-09-08 DIAGNOSIS — Z124 Encounter for screening for malignant neoplasm of cervix: Secondary | ICD-10-CM | POA: Diagnosis present

## 2023-09-08 DIAGNOSIS — A599 Trichomoniasis, unspecified: Secondary | ICD-10-CM

## 2023-09-08 DIAGNOSIS — I10 Essential (primary) hypertension: Secondary | ICD-10-CM

## 2023-09-08 DIAGNOSIS — N898 Other specified noninflammatory disorders of vagina: Secondary | ICD-10-CM

## 2023-09-08 LAB — POCT WET PREP (WET MOUNT)
Clue Cells Wet Prep Whiff POC: POSITIVE
WBC, Wet Prep HPF POC: POSITIVE

## 2023-09-08 MED ORDER — METRONIDAZOLE 500 MG PO TABS: 500.0000 mg | ORAL_TABLET | Freq: Two times a day (BID) | ORAL | 0 refills | Status: AC

## 2023-09-08 MED ORDER — LOSARTAN POTASSIUM 100 MG PO TABS: 100.0000 mg | ORAL_TABLET | Freq: Every day | ORAL | 6 refills | Status: DC

## 2023-09-08 NOTE — Progress Notes (Signed)
  Subjective:     Patient ID: Renee Bates, female   DOB: [birth year], 43 y.o.   MRN: [id number]  HPI Renee Bates is a 43 year old black female, divorced, G2P2 in complaining of vaginal discharge with slight odor, had some itching and burning and used OTC meds, without relief. She needs a pap, too.  Review of Systems +vaginal discharge with slight odor, had some itching and burning and used OTC meds, without releif Had sex few weeks ago with ex Reviewed past medical,surgical, social and family history. Reviewed medications and allergies.      Objective:   Physical Exam BP (!) 166/111 (BP Location: Right Arm, Patient Position: Sitting, Cuff Size: Large)   Pulse (!) 102   Ht 5\' 2"  (1.575 m)   Wt 203 lb (92.1 kg)   BMI 37.13 kg/m     Skin warm and dry. Lungs: clear to ausculation bilaterally. Cardiovascular: regular rate and rhythm.  Pelvic: external genitalia is normal in appearance no lesions, vagina: white discharge with odor,urethra has no lesions or masses noted, cervix:smooth, pap with GC/CHL and HR HPV genotyping performed,uterus: normal size, shape and contour, non tender, no masses felt, adnexa: no masses or tenderness noted. Bladder is non tender and no masses felt. Wet prep: + for WBC and trich Fall risk is low  [facility] - 09/08/23 0909       Pregnancy Intention Screening   Does the patient want to become pregnant in the next year? No    Does the patient's partner want to become pregnant in the next year? No    Would the patient like to discuss contraceptive options today? No      Contraception Wrap Up   Current Method Female Sterilization    End Method Female Sterilization    Contraception Counseling Provided No            Examination chaperoned by Malachy Mood LPN  Assessment:        1. Routine Papanicolaou smear Pap sent with GC/CHL  Pap in 3 years if normal  - Cytology - PAP( [place])  2. Vaginal discharge (Primary) +vaginal discharge,  Wet prep was +trich   - POCT Wet Prep [employer])  3. Trichomoniasis +trich on wet prep will rx flagyl,500 mg 1 bid x 7 days,  she said she will tel ex to see MD No sex or alcohol while taking flagyl  - POCT Wet Prep [facility]) Will do POC in 2 weeks   4. Hypertension, unspecified type Will increase cozaar to 100 mg  Watch salt and sugars better Meds ordered this encounter  Medications   losartan (COZAAR) 100 MG tablet    Sig: Take 1 tablet (100 mg total) by mouth daily.    Dispense:  30 tablet    Refill:  6    Supervising Provider:   Duane Lope H [[id number]]   metroNIDAZOLE (FLAGYL) 500 MG tablet    Sig: Take 1 tablet (500 mg total) by mouth 2 (two) times daily.    Dispense:  14 tablet    Refill:  0    Supervising Provider:   Lazaro Arms [[id number]]   Will recheck in 2 weeks   5. Establishing care with new doctor, encounter for Referred to Gilmore Laroche NP for PCP - Ambulatory Referral to Primary Care  6. Vaginal odor +odor    Plan:     Return in 2 weeks for POC and BP check

## 2023-09-13 LAB — CYTOLOGY - PAP
Chlamydia: NEGATIVE
Comment: NEGATIVE
Comment: NEGATIVE
Comment: NORMAL
Diagnosis: NEGATIVE
High risk HPV: NEGATIVE
Neisseria Gonorrhea: NEGATIVE

## 2023-09-22 ENCOUNTER — Ambulatory Visit: Payer: 59 | Admitting: Adult Health

## 2023-10-04 ENCOUNTER — Ambulatory Visit (INDEPENDENT_AMBULATORY_CARE_PROVIDER_SITE_OTHER): Payer: 59 | Admitting: Adult Health

## 2023-10-04 ENCOUNTER — Encounter (INDEPENDENT_AMBULATORY_CARE_PROVIDER_SITE_OTHER): Payer: Self-pay | Admitting: Adult Health

## 2023-10-04 VITALS — BP 131/85 | HR 102 | Temp 98.9°F | Ht 62.0 in | Wt 201.0 lb

## 2023-10-04 DIAGNOSIS — G4733 Obstructive sleep apnea (adult) (pediatric): Secondary | ICD-10-CM | POA: Diagnosis not present

## 2023-10-04 DIAGNOSIS — Z6836 Body mass index (BMI) 36.0-36.9, adult: Secondary | ICD-10-CM

## 2023-10-04 DIAGNOSIS — I10 Essential (primary) hypertension: Secondary | ICD-10-CM | POA: Diagnosis not present

## 2023-10-04 DIAGNOSIS — Z0000 Encounter for general adult medical examination without abnormal findings: Secondary | ICD-10-CM

## 2023-10-04 DIAGNOSIS — E559 Vitamin D deficiency, unspecified: Secondary | ICD-10-CM | POA: Diagnosis not present

## 2023-10-04 NOTE — Progress Notes (Signed)
 Office: [phone]  /  Fax: [phone]   Initial Visit  Renee Bates was seen in clinic today to evaluate for obesity. She is interested in losing weight to improve overall health and reduce the risk of weight related complications. She presents today to review program treatment options, initial physical assessment, and evaluation.     She was referred by: PCP  When asked what else they would like to accomplish? 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 She states: Contributed to medical problems, Contributed to orthopedic problems or mobility issues, Having fatigue, Having poor endurance, and Problems with eating patterns  Some associated conditions: Hypertension and OSA  Contributing factors: Disruption of circadian rhythm / sleep disordered breathing, Consumption of processed foods, Reduced physical activity, and Eating patterns  Weight promoting medications identified: Other: Megace  Current nutrition plan: Portion control / smart choices  Current level of physical activity: NEAT  Current or previous pharmacotherapy: Other: Qysmia  Response to medication: Lost weight and was able to maintain weight loss   Past medical history includes:   Past Medical History:  Diagnosis Date   Hypertension    Obesity    Papanicolaou smear of cervix with positive high risk human papilloma virus (HPV) test 05/06/2017   Physical exam 03/30/2014   Sleep apnea      Objective:   BP 131/85   Pulse (!) 102   Temp 98.9 F (37.2 C)   Ht 5\' 2"  (1.575 m)   Wt 201 lb (91.2 kg)   SpO2 99%   BMI 36.76 kg/m  She was weighed on the bioimpedance scale: Body mass index is 36.76 kg/m.  Peak  GNFAOZ:308 , Body Fat%:47.4, Visceral Fat Rating:12, Weight trend over the last 12 months: Decreasing  General:  Alert, oriented and cooperative. Patient is in no acute distress.  Respiratory: Normal respiratory effort, no problems with respiration noted   Gait: able to ambulate independently  Mental Status: Normal mood and affect. Normal behavior. Normal judgment and thought content.   DIAGNOSTIC DATA REVIEWED:  BMET    Component Value Date/Time   NA 136 10/21/2021 1436   NA 140 09/26/2019 1612   K 3.8 10/21/2021 1436   CL 108 10/21/2021 1436   CO2 23 10/21/2021 1436   GLUCOSE 88 10/21/2021 1436   BUN 19 10/21/2021 1436   BUN 13 09/26/2019 1612   CREATININE 0.66 11/04/2021 1223   CREATININE 0.66 09/21/2013 1250   CALCIUM 8.4 (L) 10/21/2021 1436   GFRNONAA >60 11/04/2021 1223   GFRAA 129 09/26/2019 1612   Lab Results  Component Value Date   HGBA1C 5.8 (H) 09/21/2013   No results found for: "INSULIN" CBC    Component Value Date/Time   WBC 10.2 11/05/2021 0425   RBC 3.52 (L) 11/05/2021 0425   HGB 10.6 (L) 11/05/2021 0425   HGB 13.0 09/26/2019 1612   HCT 33.0 (L) 11/05/2021 0425   HCT 39.2 09/26/2019 1612   PLT 434 (H) 11/05/2021 0425   PLT 417 09/26/2019 1612   MCV 93.8 11/05/2021 0425   MCV 93 09/26/2019 1612   MCH 30.1 11/05/2021  0425   MCHC 32.1 11/05/2021 0425   RDW 14.1 11/05/2021 0425   RDW 12.7 09/26/2019 1612   Iron/TIBC/Ferritin/ %Sat No results found for: "IRON", "TIBC", "FERRITIN", "IRONPCTSAT" Lipid Panel     Component Value Date/Time   CHOL 168 09/26/2019 1612   TRIG 86 09/26/2019 1612   HDL 61 09/26/2019 1612   CHOLHDL 2.8 09/26/2019 1612   CHOLHDL 4.6 Ratio 08/03/2007 2016   VLDL 23 08/03/2007 2016   LDLCALC 91 09/26/2019 1612   Hepatic Function Panel     Component Value Date/Time   PROT 7.2 10/21/2021 1436   PROT 6.9 09/26/2019 1612   ALBUMIN 3.7 10/21/2021 1436   ALBUMIN 4.5 09/26/2019 1612   AST 16 10/21/2021 1436   ALT 15  10/21/2021 1436   ALKPHOS 67 10/21/2021 1436   BILITOT 0.4 10/21/2021 1436   BILITOT 0.6 09/26/2019 1612      Component Value Date/Time   TSH 1.990 09/26/2019 1612     Assessment and Plan:   Healthcare maintenance  Vitamin D deficiency  OSA (obstructive sleep apnea)  Hypertension, unspecified type  Morbid obesity (HCC), Starting BMI 36.9  ESTABLISH WITH HWW   Obesity Treatment / Action Plan:  Patient will work on garnering support from family and friends to begin weight loss journey. Will work on eliminating or reducing the presence of highly palatable, calorie dense foods in the home. Will complete provided nutritional and psychosocial assessment questionnaire before the next appointment. Will be scheduled for indirect calorimetry to determine resting energy expenditure in a fasting state.  This will allow [place] to create a reduced calorie, high-protein meal plan to promote loss of fat mass while preserving muscle mass. Counseled on the health benefits of losing 5%-15% of total body weight. Was counseled on nutritional approaches to weight loss and benefits of reducing processed foods and consuming plant-based foods and high quality protein as part of nutritional weight management. Was counseled on pharmacotherapy and role as an adjunct in weight management.   Obesity Education Performed Today:  She was weighed on the bioimpedance scale and results were discussed and documented in the synopsis.  We discussed obesity as a disease and the importance of a more detailed evaluation of all the factors contributing to the disease.  We discussed the importance of long term lifestyle changes which include nutrition, exercise and behavioral modifications as well as the importance of customizing this to her specific health and social needs.  We discussed the benefits of reaching a healthier weight to alleviate the symptoms of existing conditions and reduce the risks of the  biomechanical, metabolic and psychological effects of obesity.  Renee Bates appears to be in the action stage of change and states they are ready to start intensive lifestyle modifications and behavioral modifications.  30 minutes was spent today on this visit including the above counseling, pre-visit chart review, and post-visit documentation.  Reviewed by clinician on day of visit: allergies, medications, problem list, medical history, surgical history, family history, social history, and previous encounter notes pertinent to obesity diagnosis.   Worthy Rancher, MD

## 2023-12-31 ENCOUNTER — Ambulatory Visit: Admitting: Family Medicine

## 2024-01-17 ENCOUNTER — Other Ambulatory Visit: Payer: Self-pay | Admitting: Adult Health

## 2024-03-19 ENCOUNTER — Other Ambulatory Visit: Payer: Self-pay | Admitting: Adult Health

## 2024-03-21 ENCOUNTER — Other Ambulatory Visit: Payer: Self-pay | Admitting: Adult Health

## 2024-03-21 MED ORDER — MEGESTROL ACETATE 40 MG/ML PO SUSP: ORAL | 0 refills | Status: AC

## 2024-03-21 NOTE — Progress Notes (Signed)
 Wil rx megace  susp, tablets not available

## 2024-05-19 ENCOUNTER — Other Ambulatory Visit: Payer: Self-pay | Admitting: Adult Health

## 2024-09-13 ENCOUNTER — Other Ambulatory Visit: Payer: Self-pay | Admitting: Adult Health
